# Patient Record
Sex: Female | Born: 1935 | Race: White | Hispanic: No
Health system: Midwestern US, Academic
[De-identification: ages and names within clinical notes are randomized; demographics above are authoritative.]

---

## 2017-03-31 ENCOUNTER — Encounter: Admit: 2017-03-31 | Discharge: 2017-03-31 | Payer: MEDICARE

## 2017-03-31 DIAGNOSIS — Z87891 Personal history of nicotine dependence: ICD-10-CM

## 2017-03-31 DIAGNOSIS — C4490 Unspecified malignant neoplasm of skin, unspecified: ICD-10-CM

## 2017-03-31 DIAGNOSIS — E7800 Pure hypercholesterolemia, unspecified: ICD-10-CM

## 2017-03-31 DIAGNOSIS — C50911 Malignant neoplasm of unspecified site of right female breast: ICD-10-CM

## 2017-03-31 DIAGNOSIS — I10 Essential (primary) hypertension: ICD-10-CM

## 2017-03-31 DIAGNOSIS — C50411 Malignant neoplasm of upper-outer quadrant of right female breast: ICD-10-CM

## 2017-03-31 DIAGNOSIS — Z170 Estrogen receptor positive status [ER+]: ICD-10-CM

## 2017-03-31 DIAGNOSIS — Z923 Personal history of irradiation: ICD-10-CM

## 2017-03-31 DIAGNOSIS — Z853 Personal history of malignant neoplasm of breast: ICD-10-CM

## 2017-03-31 DIAGNOSIS — N816 Rectocele: ICD-10-CM

## 2017-03-31 DIAGNOSIS — M549 Dorsalgia, unspecified: ICD-10-CM

## 2017-03-31 DIAGNOSIS — I499 Cardiac arrhythmia, unspecified: ICD-10-CM

## 2017-03-31 DIAGNOSIS — Z1231 Encounter for screening mammogram for malignant neoplasm of breast: Principal | ICD-10-CM

## 2017-03-31 NOTE — Progress Notes
Name: Stephanie Bentley          MRN: [id number]      DOB: 10-10-1932      AGE: 83 y.o.   DATE OF SERVICE: 03/31/2017    Subjective:             Reason for Visit: Ms. Stephanie Bentley returns to the clinic today for continued surveillance. She is 2.5 years s/p right lumpectomy/SLNB for a grade 2, hormone +, her2 negative, IDC. She denies any new findings on self-breast exam and she has no complaints today. She had screening mammograms prior to her visit today, and results are still pending.      Cancer Staging  Malignant neoplasm of right breast (HCC)  Staging form: Breast, AJCC 7th Edition  - Clinical: Stage IIA (T2, N0, cM0) - Signed by Dimas Alexandria, PA-C on 08/19/2014  - Pathologic: Stage IIA (T2, N0, cM0) - Signed by Jacqualin Combes, APRN-NP on 12/16/2014      History of Present Illness  DIAGNOSIS: 3.5cm Right, grade 2, estrogen +, IDC (ER100%, PR0%, HER2 0, Ki-67 68%) at 11:00, 0/4 lymph nodes, dx 07/2014, Stage IIA  PROCEDURE: Right breast sono-guided lumpectomy, right axillary sentinel lymph node biopsy (0/4), 08/29/14    HISTORY: Ms. Stephanie Bentley is a Caucasian female who presented to [facility] on 08/19/2014 at age 66 for evaluation of right breast cancer. [name] first felt a lump at the end of September 2015. She called her PCP and was sent for imaging. Bilateral diagnostic mammogram 07/10/14 [facility]) revealed a 3.5 cm bilobed mass in the right breast upper outer quadrant. Scattered calcifications were seen bilaterally. No other suspicious findings were seen. Right breast ultrasound 07/10/14 [facility]) revealed a 2.7 cm mass at 11:00, 3 cm FTN. Right breast sono-guided biopsy 07/25/14 [name]) revealed grade 2, estrogen positive, invasive ductal carcinoma. She underwent a right breast sono-guided lumpectomy, right axillary sentinel lymph node biopsy on 08/29/14. Pathology demonstrated a 3.5 cm ILC, margins negative, nuclear grade 2, histologic grade II, no LVI, 0/4 lymph nodes (ER100%, PR0%, HER2 0, Ki-67 68%). She completed Radiation Therapy with Dr. Georgia Dom in [place] at [place]. Thelma Barge in January, 2016. She started Arimidex per Dr. Biagio Borg in January, 2016.    OBGYN HISTORY: G5 P5, first live birth age 86, menarche age 76, menopause age 66, no hormone replacement therapy  PERTINENT PMH: Irregular heart beat controlled with medication  FAMILY HISTORY: No family history of breast, ovarian, prostate or pancreatic cancer  PHYSICAL EXAM on PRESENTATION: Left - No palpable breast masses. No skin, nipple, or areolar change. Right - 4 x 3.5 cm lump at 11:00, 5 cm FTN. No supraclavicular or axillary adenopathy.  MEDICAL ONCOLOGY: Dr. Biagio Borg Present Therapy: Arimidex  REFERRED BY: Self              Review of Systems  Constitutional: Negative for fever, chills, appetite change and fatigue.   HENT: Negative for hearing loss, congestion, rhinorrhea and tinnitus.    Eyes: Negative for pain, discharge and itching.   Respiratory: Negative for cough, chest tightness and shortness of breath.    Cardiovascular: Negative for chest pain and palpitations.   Gastrointestinal: Negative for abdominal distention, pain, nausea, vomiting, and diarrhea.   Genitourinary: Negative for frequency, vaginal bleeding, difficulty urinating and pelvic pain.   Musculoskeletal: Negative for myalgias, back pain, joint swelling and arthralgias.   Skin: Negative for rash.   Neurological: Negative for dizziness, weakness, light-headedness and headaches.   Hematological: Does not bruise/bleed easily.  Psychiatric/Behavioral: Negative for disturbed wake/sleep cycle. The patient is not nervous/anxious.    Allergies   Allergen Reactions   ??? Sulfa (Sulfonamide Antibiotics) NAUSEA ONLY     The following medical/surgical/family/social history and the list of medications are current, as of 03/31/2017    Past Medical History:   Diagnosis Date   ??? Back pain    ??? Cancer of skin    ??? Hypercholesterolemia 06/16/2016 ??? Hypertension 06/16/2016   ??? Invasive ductal carcinoma of right breast (HCC) 07/2014   ??? Irregular heart rate    ??? Rectocele 2014     Past Surgical History:   Procedure Laterality Date   ??? HEMORRHOIDECTOMY  1965   ??? LUMBAR LAMINECTOMY  2005    L4-5   ??? STIMULATOR IMPLANT  07/2012    BLADDER   ??? COLONOSCOPY  2014   ??? BREAST BIOPSY Right 07/24/14    NCBx   ??? BREAST LUMPECTOMY Right 08/29/14    with SLNB   ??? TONSIL AND ADENOIDECTOMY      as child     Family History   Problem Relation Age of Onset   ??? Diabetes Paternal Aunt    ??? Cancer Neg Hx      Social History     Social History   ??? Marital status: Widowed     Spouse name: N/A   ??? Number of children: N/A   ??? Years of education: N/A     Social History Main Topics   ??? Smoking status: Former Smoker     Packs/day: 1.00     Years: 40.00     Types: Cigarettes     Quit date: 09/17/1991   ??? Smokeless tobacco: Never Used   ??? Alcohol use 0.0 oz/week      Comment: social, less than weekly   ??? Drug use: No   ??? Sexual activity: Not on file     Other Topics Concern   ??? Not on file     Social History Narrative   ??? No narrative on file     Objective:         ??? acetaminophen (TYLENOL) 500 mg tablet Take 1,000 mg by mouth as Needed for Pain.   ??? amLODIPine (NORVASC) 5 mg tablet Take 1/2 tab by mouth twice a day   ??? anastrozole (ARIMIDEX) 1 mg tablet Take 1 tablet by mouth daily.   ??? CALCIUM PO Take  by mouth.   ??? cephalexin (KEFLEX) 500 mg capsule    ??? Cholecalciferol (Vitamin D3) 3,000 unit tab Take 1 Tab by mouth daily.   ??? fish oil /omega-3 fatty acids (SEA-OMEGA) 340/1000 mg capsule Take 1 Cap by mouth daily.   ??? glucosamine(+) 500 mg tab Take 1,500 mg by mouth daily.   ??? ibuprofen (MOTRIN) 200 mg tablet Take 400 mg by mouth as Needed for Pain.   ??? metoprolol (LOPRESSOR) 25 mg tablet Take 12.5 mg by mouth daily.   ??? NAPROXEN SODIUM (ALEVE PO) Take  by mouth as Needed.   ??? oxybutynin XL (DITROPAN XL) 10 mg tablet ??? potassium chloride SR (K-DUR) 10 mEq tablet Take 99 mEq by mouth daily. Take with a meal and a full glass of water.   ??? trimethoprim (TRIMPEX) 100 mg tablet Take 100 mg by mouth daily.   ??? vitamins, multiple tablet Take 1 Tab by mouth daily.     Vitals:    03/31/17 1018   BP: 123/63   Pulse: 69   Temp: 36.6 ???  C (97.9 ???F)   SpO2: 95%   Weight: 87.5 kg (192 lb 12.8 oz)   Height: 177.8 cm (70)     Body mass index is 27.66 kg/m???.               Pain Addressed:  N/A    Patient Evaluated for a Clinical Trial: No treatment clinical trial available for this patient.     [place] Cooperative Oncology Group performance status is 0, Fully active, able to carry on all pre-disease performance without restriction.Marland Kitchen     Physical Exam   Pulmonary/Chest:       Vitals reviewed.       Constitutional: No acute distress.  HEENT:  Head: Normocephalic and atraumatic.  Eyes: No discharge. No scleral icterus.  Pulmonary/Chest: No respiratory distress.   Lymphadenopathy: There is no axillary, infraclavicular, or supraclavicular adenopathy.  Neurological: Alert and oriented to person, place and time. No cranial nerve deficit.  Skin: Warm and dry. No rash noted. No erythema. No pallor.  Psychiatric: Normal mood and affect. Behavior is normal. Judgement and thought content normal.       Assessment and Plan:  1. 83 yo female 2.5 years s/p right lumpectomy/SLNB for a 3.5 cm, grade 2, estrogen +, IDC (ER100%, PR0%, HER2 0, Ki-67 68%), Stage IIA. No evidence of local or regional recurrence.  2. Completed right breast radiation therapy.  3. On Arimidex. Continue follow-up with Dr. Biagio Borg.  4. Screening mammograms today are pending; will call patient with results.  5. RTC in 6 months.    Carylon Perches, APRN    ???

## 2017-04-26 ENCOUNTER — Encounter: Admit: 2017-04-26 | Discharge: 2017-04-26 | Payer: MEDICARE

## 2017-04-26 NOTE — Telephone Encounter
Called and spoke with the patient to make her aware that her 07/05/2017 appointment would have to get rescheduled. Patient approved rescheduled to 07/19/2017 at 10:40am.

## 2017-07-19 ENCOUNTER — Encounter: Admit: 2017-07-19 | Discharge: 2017-07-19 | Payer: MEDICARE

## 2017-07-19 DIAGNOSIS — I10 Essential (primary) hypertension: ICD-10-CM

## 2017-07-19 DIAGNOSIS — Z170 Estrogen receptor positive status [ER+]: ICD-10-CM

## 2017-07-19 DIAGNOSIS — C50411 Malignant neoplasm of upper-outer quadrant of right female breast: Principal | ICD-10-CM

## 2017-07-19 DIAGNOSIS — C4490 Unspecified malignant neoplasm of skin, unspecified: ICD-10-CM

## 2017-07-19 DIAGNOSIS — I499 Cardiac arrhythmia, unspecified: ICD-10-CM

## 2017-07-19 DIAGNOSIS — M549 Dorsalgia, unspecified: ICD-10-CM

## 2017-07-19 DIAGNOSIS — C50911 Malignant neoplasm of unspecified site of right female breast: Principal | ICD-10-CM

## 2017-07-19 DIAGNOSIS — E7800 Pure hypercholesterolemia, unspecified: ICD-10-CM

## 2017-07-19 DIAGNOSIS — Z780 Asymptomatic menopausal state: ICD-10-CM

## 2017-07-19 DIAGNOSIS — Z79811 Long term (current) use of aromatase inhibitors: ICD-10-CM

## 2017-07-19 DIAGNOSIS — N816 Rectocele: ICD-10-CM

## 2017-07-19 NOTE — Progress Notes
Name: Stephanie Bentley         MRN: [id number]      DOB: 06/17/1932      AGE: 83 y.o.   DATE OF SERVICE: 07/19/2017      Subjective:             Reason for Visit:  Heme/Onc Care    Cancer Staging  Malignant neoplasm of right breast [facility])  Staging form: Breast, AJCC 7th Edition  - Clinical: Stage IIA (T2, N0, cM0) - Signed by Dimas Alexandria, PA-C on 08/19/2014  - Pathologic: Stage IIA (T2, N0, cM0) - Signed by Jacqualin Combes, APRN-NP on 12/16/2014      Oncology History    Stephanie Bentley is a very pleasant 79 y.o. postmenopausal female who presented in referral in referral from Dr. Arva Chafe for further management of right breast invasive ductal carcinoma, diagnosed 07/2014. [name] was in her usual state of good health when she appreciated a mass in her right breast in later September 2015. She then presented to her PCP for further evaluation. Bilateral diagnostic mammogram 07/10/14 [facility]) revealed a 3.5 cm bilobed mass in the right breast upper outer quadrant. Scattered calcifications were seen bilaterally. No other suspicious findings were seen. Right breast ultrasound 07/10/14 [facility]) revealed a 2.7 cm mass at 11:00, 3 cm FTN. Right breast sono-guided biopsy 07/25/14 [name]) revealed grade 2, estrogen positive, invasive ductal carcinoma. Mekah presented to the [facility] on 08/19/14. She underwent a right breast sono-guided lumpectomy, right axillary sentinel lymph node biopsy by Dr. Arva Chafe on 08/29/14. Pathology demonstrated a 3.5 cm ILC, margins negative, nuclear grade 2, histologic grade II, no LVI, 0/4 lymph nodes (ER100%, PR0%, HER2 0, Ki-67 68%).     Current Therapy: Arimidex, initiated 10/2014.    Ethie returns today, accompanied by her daughter, for routine 6 month follow for Arimidex and review recent imaging. She is feeling well. Continues to take Arimidex daily. Reports back pain. Denies arthralgias. Pt denies any current breast health issues, specifically denies any breast mass, pain, nipple discharge, skin changes, or rash. No new health conditions voiced.          Review of Systems   Constitutional: Negative for activity change, appetite change, chills, diaphoresis, fatigue, fever and unexpected weight change.   HENT: Positive for hearing loss. Negative for congestion, mouth sores, sore throat, tinnitus and trouble swallowing.    Eyes: Negative for redness (chronic, no concerns today) and visual disturbance.   Respiratory: Negative for cough, chest tightness, shortness of breath and wheezing.    Cardiovascular: Negative for chest pain, palpitations and leg swelling.   Gastrointestinal: Negative for abdominal distention, abdominal pain, blood in stool, constipation, diarrhea, nausea and vomiting.   Genitourinary: Positive for enuresis (chronic, no change), frequency and urgency (chronic). Negative for difficulty urinating, dysuria and hematuria.   Musculoskeletal: Positive for back pain (chronic, intermittent). Negative for arthralgias, gait problem, joint swelling, myalgias, neck pain and neck stiffness.   Skin: Negative for rash.   Neurological: Negative for dizziness, syncope, weakness, numbness and headaches.   Hematological: Negative for adenopathy. Bruises/bleeds easily.   Psychiatric/Behavioral: Negative for sleep disturbance. The patient is not nervous/anxious.      Past Medical History:   Diagnosis Date   ??? Back pain    ??? Cancer of skin    ??? Hypercholesterolemia 06/16/2016   ??? Hypertension 06/16/2016   ??? Invasive ductal carcinoma of right breast (HCC) 07/2014   ??? Irregular heart rate    ??? Rectocele  2014     Past Surgical History:   Procedure Laterality Date   ??? HEMORRHOIDECTOMY  1965   ??? LUMBAR LAMINECTOMY  2005    L4-5   ??? STIMULATOR IMPLANT  07/2012    BLADDER   ??? COLONOSCOPY  2014   ??? BREAST BIOPSY Right 07/24/14    NCBx   ??? BREAST LUMPECTOMY Right 08/29/14    with SLNB   ??? TONSIL AND ADENOIDECTOMY      as child     Family History   Problem Relation Age of Onset ??? Diabetes Paternal Aunt    ??? Cancer Neg Hx      Social History     Social History   ??? Marital status: Widowed     Spouse name: N/A   ??? Number of children: N/A   ??? Years of education: N/A     Social History Main Topics   ??? Smoking status: Former Smoker     Packs/day: 1.00     Years: 40.00     Types: Cigarettes     Quit date: 09/17/1991   ??? Smokeless tobacco: Never Used   ??? Alcohol use 0.0 oz/week      Comment: social, less than weekly   ??? Drug use: No   ??? Sexual activity: Not on file     Other Topics Concern   ??? Not on file     Social History Narrative   ??? No narrative on file           Objective:         ??? acetaminophen (TYLENOL) 500 mg tablet Take 1,000 mg by mouth as Needed for Pain.   ??? amLODIPine (NORVASC) 5 mg tablet Take 1/2 tab by mouth twice a day   ??? anastrozole (ARIMIDEX) 1 mg tablet Take 1 tablet by mouth daily.   ??? CALCIUM PO Take  by mouth.   ??? cephalexin (KEFLEX) 500 mg capsule    ??? Cholecalciferol (Vitamin D3) 3,000 unit tab Take 1 Tab by mouth daily.   ??? fish oil /omega-3 fatty acids (SEA-OMEGA) 340/1000 mg capsule Take 1 Cap by mouth daily.   ??? glucosamine(+) 500 mg tab Take 1,500 mg by mouth daily.   ??? ibuprofen (MOTRIN) 200 mg tablet Take 400 mg by mouth as Needed for Pain.   ??? metoprolol (LOPRESSOR) 25 mg tablet Take 12.5 mg by mouth daily.   ??? NAPROXEN SODIUM (ALEVE PO) Take  by mouth as Needed.   ??? oxybutynin XL (DITROPAN XL) 10 mg tablet    ??? potassium chloride SR (K-DUR) 10 mEq tablet Take 99 mEq by mouth daily. Take with a meal and a full glass of water.   ??? vitamins, multiple tablet Take 1 Tab by mouth daily.     Vitals:    07/19/17 1043   BP: 140/71   Pulse: 59   Resp: 18   Temp: 36.9 ???C (98.5 ???F)   TempSrc: Oral   SpO2: 95%   Weight: 85.9 kg (189 lb 6.4 oz)   Height: 177.8 cm (70)     Body mass index is 27.18 kg/m???.     Pain Score: Five (Back pain)         Pain Addressed:  Patient declines intervention and Patient to call office if pain not relieved or worsened Patient Evaluated for a Clinical Trial: No treatment clinical trial available for this patient.     [place] Cooperative Oncology Group performance status is 0, Fully active, able to carry  on all pre-disease performance without restriction.Marland Kitchen     Physical Exam   Constitutional: She is oriented to person, place, and time. She appears well-developed and well-nourished. No distress.   HENT:   Head: Normocephalic and atraumatic.   Mouth/Throat: Oropharynx is clear and moist. No oral lesions. No oropharyngeal exudate.   Eyes: Pupils are equal, round, and reactive to light. Conjunctivae and EOM are normal. No scleral icterus.   Neck: Normal range of motion. Neck supple. No tracheal deviation present.   Cardiovascular: Normal rate, regular rhythm and normal heart sounds.  Exam reveals no gallop and no friction rub.    No murmur heard.  Pulmonary/Chest: Effort normal and breath sounds normal. She has no wheezes. She has no rhonchi. She has no rales. Right breast exhibits no inverted nipple, no mass, no nipple discharge, no skin change and no tenderness. Left breast exhibits no inverted nipple, no mass, no nipple discharge, no skin change and no tenderness. Breasts are symmetrical. There is no breast swelling.       Abdominal: Soft. Normal appearance. She exhibits no distension and no mass. There is no tenderness. There is no guarding.   Genitourinary: No breast tenderness, discharge or bleeding.   Musculoskeletal: Normal range of motion. She exhibits no edema.   Lymphadenopathy:     She has no cervical adenopathy.     She has no axillary adenopathy.        Right: No supraclavicular adenopathy present.        Left: No supraclavicular adenopathy present.   Neurological: She is alert and oriented to person, place, and time. She has normal strength. Coordination normal.   Skin: Skin is warm and dry. No rash noted. She is not diaphoretic. No pallor.   Psychiatric: She has a normal mood and affect. Her behavior is normal. Judgment and thought content normal.   Vitals reviewed.       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 ???The   fracture risk is moderate.  Follow up DEXA scan in 1 year is suggested while the patient is on   therapy.   -T-score of -1.4 .???    DIGITAL MAMMO SCREEN BILAT/TOMO/CAD: March 31, 2017  -There are scattered areas of fibroglandular density.  BIRAD 2-Benign.     Assessment and Plan:     The patient is a 83 y.o. postmenopausal female with the following:    1.  Right 3.5 cm ILC, grade II, no LVI, 0/4 lymph nodes.  Stage IIA; pT2 N0 M0.  ER100%, PR0%, HER2 0, Ki-67 68%. S/p lumpectomy with SLNB on 08/29/14.  Pathologic Staging: IIA pT2 N0 cM0. No clinical evidence of disease. S/p right breast radiation 09/25/14-10/20/14.   2.  Mild osteopenia, T-score -1.4 per DEXA 12/28/16, next DEXA due 12/2017.     Theda is without evidence of disease recurrence at this time based on physical exam and recent imaging. We will continue Arimidex at this time.    Reviewed imaging with patient.    Dexa to evaluate bone health.  Continue calcium and vitamin D supplements as well as weight bearing exercise (such as walking and light strength training) for bone health. Recommended 30 minutes of exercise most days of the week.    Nohelani was encouraged to call with any persistent symptoms, questions, or concerns.    We will see Lyndsy in 6 months for routine follow up.  In the presence of Dellia Cloud, MD,  I have taken down these notes, Clarene Duke, LPN, Scribe. 07/19/2017 11:32 AM.

## 2017-10-03 ENCOUNTER — Encounter: Admit: 2017-10-03 | Discharge: 2017-10-03 | Payer: MEDICARE

## 2017-10-03 DIAGNOSIS — M549 Dorsalgia, unspecified: ICD-10-CM

## 2017-10-03 DIAGNOSIS — Z87891 Personal history of nicotine dependence: ICD-10-CM

## 2017-10-03 DIAGNOSIS — C4490 Unspecified malignant neoplasm of skin, unspecified: ICD-10-CM

## 2017-10-03 DIAGNOSIS — E7800 Pure hypercholesterolemia, unspecified: ICD-10-CM

## 2017-10-03 DIAGNOSIS — C50911 Malignant neoplasm of unspecified site of right female breast: Principal | ICD-10-CM

## 2017-10-03 DIAGNOSIS — Z1231 Encounter for screening mammogram for malignant neoplasm of breast: ICD-10-CM

## 2017-10-03 DIAGNOSIS — N816 Rectocele: ICD-10-CM

## 2017-10-03 DIAGNOSIS — I499 Cardiac arrhythmia, unspecified: ICD-10-CM

## 2017-10-03 DIAGNOSIS — Z170 Estrogen receptor positive status [ER+]: ICD-10-CM

## 2017-10-03 DIAGNOSIS — I10 Essential (primary) hypertension: ICD-10-CM

## 2017-10-03 NOTE — Progress Notes
Name: Stephanie Bentley          MRN: [id number]      DOB: 05-08-1932      AGE: 83 y.o.   DATE OF SERVICE: 10/03/2017    Subjective:             Reason for Visit: Stephanie Bentley returns to the clinica today for continue surveillance. She is 3 years s/p right lumpectomy/SLNB for a grade 2, hormone +, Her2 negative, IDC. She denies any new findings on self-breast exam and she has no complaints today. She states that she recently had two skin cancers removed from the back of her neck.        Cancer Staging  Malignant neoplasm of right breast (HCC)  Staging form: Breast, AJCC 7th Edition  - Clinical: Stage IIA (T2, N0, cM0) - Signed by Dimas Alexandria, PA-C on 08/19/2014  - Pathologic: Stage IIA (T2, N0, cM0) - Signed by Jacqualin Combes, APRN-NP on 12/16/2014      History of Present Illness  DIAGNOSIS: 3.5cm Right, grade 2, estrogen +, IDC (ER100%, PR0%, HER2 0, Ki-67 68%) at 11:00, 0/4 lymph nodes, dx 07/2014, Stage IIA  PROCEDURE: Right breast sono-guided lumpectomy, right axillary sentinel lymph node biopsy (0/4), 08/29/14    HISTORY: Stephanie Bentley is a Caucasian female who presented to [facility] on 08/19/2014 at age 28 for evaluation of right breast cancer. Stephanie Bentley first felt a lump at the end of September 2015. She called her PCP and was sent for imaging. Bilateral diagnostic mammogram 07/10/14 [facility]) revealed a 3.5 cm bilobed mass in the right breast upper outer quadrant. Scattered calcifications were seen bilaterally. No other suspicious findings were seen. Right breast ultrasound 07/10/14 [facility]) revealed a 2.7 cm mass at 11:00, 3 cm FTN. Right breast sono-guided biopsy 07/25/14 [name]) revealed grade 2, estrogen positive, invasive ductal carcinoma. She underwent a right breast sono-guided lumpectomy, right axillary sentinel lymph node biopsy on 08/29/14. Pathology demonstrated a 3.5 cm ILC, margins negative, nuclear grade 2, histologic grade II, no LVI, 0/4 lymph nodes (ER100%, PR0%, HER2 0, Ki-67 68%). She completed Radiation Therapy with Dr. Georgia Dom in [place] at [place]. Thelma Barge in January, 2016. She started Arimidex per Dr. Biagio Borg in January, 2016.    OBGYN HISTORY: G5 P5, first live birth age 26, menarche age 81, menopause age 71, no hormone replacement therapy  PERTINENT PMH: Irregular heart beat controlled with medication  FAMILY HISTORY: No family history of breast, ovarian, prostate or pancreatic cancer  PHYSICAL EXAM on PRESENTATION: Left - No palpable breast masses. No skin, nipple, or areolar change. Right - 4 x 3.5 cm lump at 11:00, 5 cm FTN. No supraclavicular or axillary adenopathy.  MEDICAL ONCOLOGY: Dr. Biagio Borg Present Therapy: Arimidex  REFERRED BY: Self              Review of Systems  Constitutional: Negative for fever, chills, appetite change and fatigue.   HENT: Negative for hearing loss, congestion, rhinorrhea and tinnitus.    Eyes: Negative for pain, discharge and itching.   Respiratory: Negative for cough, chest tightness and shortness of breath.    Cardiovascular: Negative for chest pain and palpitations.   Gastrointestinal: Negative for abdominal distention, pain, nausea, vomiting, and diarrhea.   Genitourinary: Negative for frequency, vaginal bleeding, difficulty urinating and pelvic pain.   Musculoskeletal: Negative for myalgias, back pain, joint swelling and arthralgias.   Skin: Negative for rash.   Neurological: Negative for dizziness, weakness, light-headedness and headaches.   Hematological: Does  not bruise/bleed easily.   Psychiatric/Behavioral: Negative for disturbed wake/sleep cycle. The patient is not nervous/anxious.    Allergies   Allergen Reactions   ??? Amoxicillin HIVES   ??? Sulfa (Sulfonamide Antibiotics) NAUSEA ONLY     Past Medical History:   Diagnosis Date   ??? Back pain    ??? Cancer of skin    ??? Hypercholesterolemia 06/16/2016   ??? Hypertension 06/16/2016   ??? Invasive ductal carcinoma of right breast (HCC) 07/2014 ??? Irregular heart rate    ??? Rectocele 2014     Past Surgical History:   Procedure Laterality Date   ??? HEMORRHOIDECTOMY  1965   ??? LUMBAR LAMINECTOMY  2005    L4-5   ??? STIMULATOR IMPLANT  07/2012    BLADDER   ??? COLONOSCOPY  2014   ??? BREAST BIOPSY Right 07/24/14    NCBx   ??? BREAST LUMPECTOMY Right 08/29/14    with SLNB   ??? TONSIL AND ADENOIDECTOMY      as child     Family History   Problem Relation Age of Onset   ??? Diabetes Paternal Aunt    ??? Cancer Neg Hx      Social History     Socioeconomic History   ??? Marital status: Widowed     Spouse name: Not on file   ??? Number of children: Not on file   ??? Years of education: Not on file   ??? Highest education level: Not on file   Social Needs   ??? Financial resource strain: Not on file   ??? Food insecurity - worry: Not on file   ??? Food insecurity - inability: Not on file   ??? Transportation needs - medical: Not on file   ??? Transportation needs - non-medical: Not on file   Occupational History   ??? Not on file   Tobacco Use   ??? Smoking status: Former Smoker     Packs/day: 1.00     Years: 40.00     Pack years: 40.00     Types: Cigarettes     Last attempt to quit: 09/17/1991     Years since quitting: 26.0   ??? Smokeless tobacco: Never Used   Substance and Sexual Activity   ??? Alcohol use: Yes     Alcohol/week: 0.0 oz     Comment: social, less than weekly   ??? Drug use: No   ??? Sexual activity: Not on file   Other Topics Concern   ??? Not on file   Social History Narrative   ??? Not on file       Objective:         ??? acetaminophen (TYLENOL) 500 mg tablet Take 1,000 mg by mouth as Needed for Pain.   ??? amLODIPine (NORVASC) 5 mg tablet Take 1/2 tab by mouth twice a day   ??? anastrozole (ARIMIDEX) 1 mg tablet Take 1 tablet by mouth daily.   ??? CALCIUM PO Take  by mouth.   ??? cephalexin (KEFLEX) 500 mg capsule    ??? Cholecalciferol (Vitamin D3) 3,000 unit tab Take 1 Tab by mouth daily.   ??? fish oil /omega-3 fatty acids (SEA-OMEGA) 340/1000 mg capsule Take 1 Cap by mouth daily. ??? glucosamine(+) 500 mg tab Take 1,500 mg by mouth daily.   ??? ibuprofen (MOTRIN) 200 mg tablet Take 400 mg by mouth as Needed for Pain.   ??? metoprolol (LOPRESSOR) 25 mg tablet Take 12.5 mg by mouth daily.   ??? NAPROXEN SODIUM (  ALEVE PO) Take  by mouth as Needed.   ??? oxybutynin XL (DITROPAN XL) 10 mg tablet    ??? potassium chloride SR (K-DUR) 10 mEq tablet Take 99 mEq by mouth daily. Take with a meal and a full glass of water.   ??? vitamins, multiple tablet Take 1 Tab by mouth daily.     Vitals:    10/03/17 1547 10/03/17 1549   BP: 142/67    Pulse: 72    Resp: 18 18   Temp: 36.5 ???C (97.7 ???F)    TempSrc: Oral Oral   SpO2: 98%    Weight: 85.8 kg (189 lb 3.2 oz)    Height: 177.8 cm (70)      Body mass index is 27.15 kg/m???.     Pain Score: Zero         Pain Addressed:  N/A    Patient Evaluated for a Clinical Trial: No treatment clinical trial available for this patient.     [place] Cooperative Oncology Group performance status is 0, Fully active, able to carry on all pre-disease performance without restriction.Marland Kitchen     Physical Exam   Pulmonary/Chest:       Vitals reviewed.       Constitutional: No acute distress.  HEENT:  Head: Normocephalic and atraumatic.  Eyes: No discharge. No scleral icterus.  Pulmonary/Chest: No respiratory distress.   Lymphadenopathy: There is no axillary, infraclavicular, or supraclavicular adenopathy.  Neurological: Alert and oriented to person, place and time. No cranial nerve deficit.  Skin: Warm and dry. No rash noted. No erythema. No pallor.  Psychiatric: Normal mood and affect. Behavior is normal. Judgement and thought content normal.       Assessment and Plan:  1. 83 yo female 3???years s/p right lumpectomy/SLNB for a 3.5 cm, grade 2, ER+, IDC (ER100%, PR0%, HER2 0, Ki-67 68%), Stage IIA. No evidence of local or regional recurrence.  2. Completed???right breast radiation therapy.  3. On Arimidex. Continue follow-up with Dr. Biagio Borg.  4. Screening mammograms in June 2018 were BIRADS 2. 5. Due to the number of health care visits the patient now has, it was discussed that she may continue follow-up with med/onc only and return to the surgery clinic prn since she is 3 years out from surgery. She was agreeable.  6. Will schedule screening mammograms for June 2019. RTC prn    Carylon Perches, APRN       ???

## 2017-10-12 ENCOUNTER — Encounter: Admit: 2017-10-12 | Discharge: 2017-10-12 | Payer: MEDICARE

## 2017-10-12 MED ORDER — ANASTROZOLE 1 MG PO TAB
1 mg | ORAL_TABLET | Freq: Every day | ORAL | 3 refills | 33.00000 days | Status: AC
Start: 2017-10-12 — End: 2018-10-02

## 2017-12-27 ENCOUNTER — Encounter: Admit: 2017-12-27 | Discharge: 2017-12-27 | Payer: MEDICARE

## 2018-01-19 ENCOUNTER — Encounter: Admit: 2018-01-19 | Discharge: 2018-01-19 | Payer: MEDICARE

## 2018-01-19 DIAGNOSIS — Z170 Estrogen receptor positive status [ER+]: ICD-10-CM

## 2018-01-19 DIAGNOSIS — C50411 Malignant neoplasm of upper-outer quadrant of right female breast: ICD-10-CM

## 2018-01-19 DIAGNOSIS — Z780 Asymptomatic menopausal state: ICD-10-CM

## 2018-01-19 DIAGNOSIS — I10 Essential (primary) hypertension: ICD-10-CM

## 2018-01-19 DIAGNOSIS — Z79811 Long term (current) use of aromatase inhibitors: ICD-10-CM

## 2018-01-19 DIAGNOSIS — C50911 Malignant neoplasm of unspecified site of right female breast: Principal | ICD-10-CM

## 2018-01-19 DIAGNOSIS — N816 Rectocele: ICD-10-CM

## 2018-01-19 DIAGNOSIS — M549 Dorsalgia, unspecified: ICD-10-CM

## 2018-01-19 DIAGNOSIS — I499 Cardiac arrhythmia, unspecified: ICD-10-CM

## 2018-01-19 DIAGNOSIS — M8580 Other specified disorders of bone density and structure, unspecified site: Principal | ICD-10-CM

## 2018-01-19 DIAGNOSIS — E7800 Pure hypercholesterolemia, unspecified: ICD-10-CM

## 2018-01-19 DIAGNOSIS — C4490 Unspecified malignant neoplasm of skin, unspecified: ICD-10-CM

## 2018-03-09 ENCOUNTER — Encounter: Admit: 2018-03-09 | Discharge: 2018-03-09 | Payer: MEDICARE

## 2018-03-29 ENCOUNTER — Ambulatory Visit: Admit: 2018-03-29 | Discharge: 2018-03-30 | Payer: MEDICARE

## 2018-03-29 ENCOUNTER — Encounter: Admit: 2018-03-29 | Discharge: 2018-03-29 | Payer: MEDICARE

## 2018-03-29 DIAGNOSIS — R609 Edema, unspecified: Principal | ICD-10-CM

## 2018-04-13 ENCOUNTER — Encounter: Admit: 2018-04-13 | Discharge: 2018-04-13 | Payer: MEDICARE

## 2018-04-13 DIAGNOSIS — Z1231 Encounter for screening mammogram for malignant neoplasm of breast: Principal | ICD-10-CM

## 2018-08-01 ENCOUNTER — Encounter: Admit: 2018-08-01 | Discharge: 2018-08-01 | Payer: MEDICARE

## 2018-08-01 DIAGNOSIS — I499 Cardiac arrhythmia, unspecified: ICD-10-CM

## 2018-08-01 DIAGNOSIS — C50911 Malignant neoplasm of unspecified site of right female breast: Principal | ICD-10-CM

## 2018-08-01 DIAGNOSIS — M8580 Other specified disorders of bone density and structure, unspecified site: ICD-10-CM

## 2018-08-01 DIAGNOSIS — I10 Essential (primary) hypertension: ICD-10-CM

## 2018-08-01 DIAGNOSIS — C4490 Unspecified malignant neoplasm of skin, unspecified: ICD-10-CM

## 2018-08-01 DIAGNOSIS — Z87891 Personal history of nicotine dependence: ICD-10-CM

## 2018-08-01 DIAGNOSIS — Z170 Estrogen receptor positive status [ER+]: ICD-10-CM

## 2018-08-01 DIAGNOSIS — N816 Rectocele: ICD-10-CM

## 2018-08-01 DIAGNOSIS — E7800 Pure hypercholesterolemia, unspecified: ICD-10-CM

## 2018-08-01 DIAGNOSIS — M549 Dorsalgia, unspecified: ICD-10-CM

## 2018-08-01 DIAGNOSIS — C50411 Malignant neoplasm of upper-outer quadrant of right female breast: Principal | ICD-10-CM

## 2018-08-01 DIAGNOSIS — Z79811 Long term (current) use of aromatase inhibitors: ICD-10-CM

## 2018-10-02 ENCOUNTER — Encounter: Admit: 2018-10-02 | Discharge: 2018-10-02 | Payer: MEDICARE

## 2018-10-02 MED ORDER — ANASTROZOLE 1 MG PO TAB
1 mg | ORAL_TABLET | Freq: Every day | ORAL | 3 refills | 33.00000 days | Status: AC
Start: 2018-10-02 — End: 2019-09-09

## 2019-01-08 ENCOUNTER — Encounter: Admit: 2019-01-08 | Discharge: 2019-01-08 | Payer: MEDICARE

## 2019-04-04 ENCOUNTER — Encounter: Admit: 2019-04-04 | Discharge: 2019-04-04

## 2019-04-04 DIAGNOSIS — Z1231 Encounter for screening mammogram for malignant neoplasm of breast: Secondary | ICD-10-CM

## 2019-04-11 NOTE — Progress Notes
Name: Stephanie Bentley         MRN: [id number]      DOB: Sep 15, 1932      AGE: 85 y.o.   DATE OF SERVICE: 04/12/2019      Subjective:             Reason for Visit:  Heme/Onc Care    Cancer Staging  Malignant neoplasm of right breast [facility])  Staging form: Breast, AJCC 7th Edition  - Clinical: Stage IIA (T2, N0, cM0) - Signed by Dimas Alexandria, PA-C on 08/19/2014  - Pathologic: Stage IIA (T2, N0, cM0) - Signed by Jacqualin Combes, APRN-NP on 12/16/2014      Oncology History    Stephanie Bentley is a very pleasant 87 y.o. postmenopausal female who presented in referral in referral from Dr. Arva Chafe for further management of right breast invasive ductal carcinoma, diagnosed 07/2014. Stephanie Bentley was in her usual state of good health when she appreciated a mass in her right breast in later September 2015. She then presented to her PCP for further evaluation. Bilateral diagnostic mammogram 07/10/14 [facility]) revealed a 3.5 cm bilobed mass in the right breast upper outer quadrant. Scattered calcifications were seen bilaterally. No other suspicious findings were seen. Right breast ultrasound 07/10/14 [facility]) revealed a 2.7 cm mass at 11:00, 3 cm FTN. Right breast sono-guided biopsy 07/25/14 [name]) revealed grade 2, estrogen positive, invasive ductal carcinoma. [name] presented to the [facility] on 08/19[facility]14. She underwent a right breast sono-guided lumpectomy, right axillary sentinel lymph node biopsy by Dr. Arva Chafe on 08/29/14. Pathology demonstrated a 3.5 cm ILC, margins negative, nuclear grade 2, histologic grade II, no LVI, 0/4 lymph nodes (ER100%, PR0%, HER2 0, Ki-67 68%).     Current Therapy: Arimidex, initiated 10/2014.    History of Present Illness   Stephanie Bentley returns today, accompanied by her daughter, for routine 6 month follow up for Arimidex. She is feeling well. Continues to take Arimidex daily. Reports no change in chronic back pain.  Pt denies any current breast health issues, specifically denies any breast mass, pain, nipple discharge, skin changes, or rash. No new health conditions voiced.  No new health conditions voiced.          Review of Systems   Constitutional: Negative for activity change, appetite change, chills, diaphoresis, fatigue, fever and unexpected weight change.   HENT: Positive for hearing loss. Negative for congestion, mouth sores, sore throat, tinnitus and trouble swallowing.    Eyes: Negative for redness and visual disturbance.   Respiratory: Negative for cough, chest tightness, shortness of breath and wheezing.    Cardiovascular: Negative for chest pain, palpitations and leg swelling.   Gastrointestinal: Negative for abdominal distention, abdominal pain, blood in stool, constipation, diarrhea, nausea and vomiting.   Genitourinary: Positive for enuresis (chronic, no change) and urgency (chronic). Negative for difficulty urinating, dysuria, frequency and hematuria.   Musculoskeletal: Positive for back pain (chronic, intermittent). Negative for arthralgias, gait problem, joint swelling, myalgias, neck pain and neck stiffness.   Skin: Negative for rash.   Neurological: Negative for dizziness, syncope, weakness, numbness and headaches.   Hematological: Negative for adenopathy. Bruises/bleeds easily.   Psychiatric/Behavioral: Negative for sleep disturbance. The patient is not nervous/anxious.      Medical History:   Diagnosis Date   ??? Back pain    ??? Cancer of skin    ??? Hypercholesterolemia 06/16/2016   ??? Hypertension 06/16/2016   ??? Invasive ductal carcinoma of right breast (HCC) 07/2014   ??? Irregular  heart rate    ??? Rectocele 2014     Surgical History:   Procedure Laterality Date   ??? HEMORRHOIDECTOMY  1965   ??? LUMBAR LAMINECTOMY  2005    L4-5   ??? STIMULATOR IMPLANT  07/2012    BLADDER   ??? COLONOSCOPY  2014   ??? BREAST BIOPSY Right 07/24/14    NCBx   ??? BREAST LUMPECTOMY Right 08/29/14    with SLNB   ??? TONSIL AND ADENOIDECTOMY      as child Family History   Problem Relation Age of Onset   ??? Diabetes Paternal Aunt    ??? Cancer Neg Hx      Social History     Socioeconomic History   ??? Marital status: Widowed     Spouse name: Not on file   ??? Number of children: Not on file   ??? Years of education: Not on file   ??? Highest education level: Not on file   Occupational History   ??? Not on file   Tobacco Use   ??? Smoking status: Former Smoker     Packs/day: 1.00     Years: 40.00     Pack years: 40.00     Types: Cigarettes     Last attempt to quit: 09/17/1991     Years since quitting: 27.5   ??? Smokeless tobacco: Never Used   Substance and Sexual Activity   ??? Alcohol use: Yes     Alcohol/week: 0.0 standard drinks     Comment: social, less than weekly   ??? Drug use: No   ??? Sexual activity: Not on file   Other Topics Concern   ??? Not on file   Social History Narrative   ??? Not on file       Objective:         ??? acetaminophen (TYLENOL) 500 mg tablet Take 1,000 mg by mouth as Needed for Pain.   ??? amLODIPine (NORVASC) 5 mg tablet Take 1/2 tab by mouth twice a day   ??? anastrozole (ARIMIDEX) 1 mg tablet Take one tablet by mouth daily.   ??? CALCIUM PO Take  by mouth.   ??? cephalexin (KEFLEX) 500 mg capsule    ??? Cholecalciferol (Vitamin D3) 3,000 unit tab Take 1 Tab by mouth daily.   ??? fish oil /omega-3 fatty acids (SEA-OMEGA) 340/1000 mg capsule Take 1 Cap by mouth daily.   ??? glucosamine(+) 500 mg tab Take 1,500 mg by mouth daily.   ??? ibuprofen (MOTRIN) 200 mg tablet Take 400 mg by mouth as Needed for Pain.   ??? lactobacillus combination no.4 (SENIOR PROBIOTIC PO) Take  by mouth.   ??? metoprolol (LOPRESSOR) 25 mg tablet Take 12.5 mg by mouth daily.   ??? NAPROXEN SODIUM (ALEVE PO) Take  by mouth as Needed.   ??? oxybutynin XL (DITROPAN XL) 10 mg tablet    ??? polyethylene glycol 3350 (MIRALAX) 17 g packet Take 17 g by mouth daily.   ??? potassium chloride SR (K-DUR) 10 mEq tablet Take 99 mEq by mouth daily. Take with a meal and a full glass of water. ??? psyllium (METAMUCIL) 3.4 gram packet Take  by mouth twice daily.   ??? trospium(+) (SANCTURA) 20 mg tablet Take 20 mg by mouth three times daily before meals.   ??? vitamins, multiple tablet Take 1 Tab by mouth daily.     Vitals:    04/12/19 1147 04/12/19 1154   BP: 136/75    BP Source: Arm, Left  Upper    Patient Position: Sitting    Pulse: 75    Resp: 16    Temp: 36.4 ???C (97.6 ???F)    TempSrc: Oral    SpO2: 96%    Weight: 87.2 kg (192 lb 3.2 oz)    Height: 175.3 cm (69.02)    PainSc:  Zero     Body mass index is 28.37 kg/m???.     Pain Score: Zero         Pain Addressed:  Patient declines intervention and Patient to call office if pain not relieved or worsened  Patient Evaluated for a Clinical Trial: No treatment clinical trial available for this patient.     [place] Cooperative Oncology Group performance status is 0, Fully active, able to carry on all pre-disease performance without restriction.Marland Kitchen     Physical Exam  Vitals signs reviewed.   Constitutional:       General: She is not in acute distress.     Appearance: Normal appearance. She is well-developed. She is not diaphoretic.   HENT:      Head: Normocephalic and atraumatic.      Mouth/Throat:      Mouth: No oral lesions.      Pharynx: No oropharyngeal exudate.   Eyes:      General: No scleral icterus.     Conjunctiva/sclera: Conjunctivae normal.      Pupils: Pupils are equal, round, and reactive to light.   Neck:      Musculoskeletal: Normal range of motion and neck supple.      Trachea: No tracheal deviation.   Cardiovascular:      Rate and Rhythm: Normal rate and regular rhythm.      Heart sounds: Normal heart sounds. No murmur. No friction rub. No gallop.    Pulmonary:      Effort: Pulmonary effort is normal.      Breath sounds: Normal breath sounds. No wheezing, rhonchi or rales.   Chest:      Breasts: Breasts are symmetrical.         Right: No inverted nipple, mass, nipple discharge, skin change or tenderness. Left: No inverted nipple, mass, nipple discharge, skin change or tenderness.       Abdominal:      General: There is no distension.      Palpations: Abdomen is soft. There is no mass.      Tenderness: There is no abdominal tenderness. There is no guarding.   Musculoskeletal: Normal range of motion.   Lymphadenopathy:      Cervical: No cervical adenopathy.      Upper Body:      Right upper body: No supraclavicular adenopathy.      Left upper body: No supraclavicular adenopathy.   Skin:     General: Skin is warm and dry.      Coloration: Skin is not pale.      Findings: No rash.   Neurological:      Mental Status: She is alert and oriented to person, place, and time.      Coordination: Coordination normal.   Psychiatric:         Behavior: Behavior normal.         Thought Content: Thought content normal.         Judgment: Judgment normal.          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 ???The   fracture risk is moderate.  Follow up DEXA scan in  1 year is suggested while the patient is on   therapy.   -T-score of -1.4 .???    DIGITAL MAMMO SCREEN BILAT/TOMO/CAD: March 31, 2017  -There are scattered areas of fibroglandular density.  BIRAD 2-Benign.    Mammogram (04/13/2018):  There are scattered areas of fibroglandular density.   BIRAD-2 benign    DEXA (01/19/2018):    LEFT FOREARM (radius 33%)  Current: 0.689 g/cm2, T-score of -2.2, Previous: 0.789 g/cm2, T-score of -1.1  LEFT FEMORAL NECK ???  Current: 0.887 g/cm2, T-score of -1.1, Previous: 0.950 g/cm2, T-score of -0.6  LEFT TOTAL HIP ???  Current: 0.802 g/cm2, T-score of -1.6, Previous: 0.883 g/cm2, T-score of -1.0    DEXA 04/12/2019: pending       Assessment and Plan:     The patient is a 85 y.o. postmenopausal female with the following:    1.  Right 3.5 cm ILC, grade II, no LVI, 0/4 lymph nodes.  Stage IIA; pT2 N0 M0.  ER100%, PR0%, HER2 0, Ki-67 68%. S/p lumpectomy with SLNB on 08/29/14.  Pathologic Staging: IIA pT2 N0 cM0. No clinical evidence of disease. S/p right breast radiation 09/25/14-10/20/14.   2.  Osteopenia: T -2.2 in left forearm, -1.1 in left femoral neck, -1.6 in left total hip, on calcium supplement and vitamin D.    Plan:  -Tonasia is without evidence of disease at this time.   - Continue with armidex 1 mg po daily (started in 10/2014)  - Repeat DEXA scan in 6 months, today results pedning.   - Repeat mammogram in 03/2019, delayed due to COVID, scheduled for 05/18/19.  - RTC in 6 months    Dr. Biagio Borg is the collaborating physician.     Jacqualin Combes, APRN-NP

## 2019-04-11 NOTE — Patient Instructions
Thank you for coming to see us today.   Please call our office or send a message through MyChart if you have any questions or concerns.          Dr. Anne O'Dea and Michelle Prager, APRN  [phone] (Nurse Tara Gardner/Udell Mazzocco)  [phone] (fax)    [facility] Location  [address]  [place], [place] [address]    [facility] Location  [address]  [place], [place] [address]

## 2019-04-12 ENCOUNTER — Encounter: Admit: 2019-04-12 | Discharge: 2019-04-12

## 2019-04-12 DIAGNOSIS — C50411 Malignant neoplasm of upper-outer quadrant of right female breast: Secondary | ICD-10-CM

## 2019-04-12 DIAGNOSIS — I10 Essential (primary) hypertension: Secondary | ICD-10-CM

## 2019-04-12 DIAGNOSIS — I499 Cardiac arrhythmia, unspecified: Secondary | ICD-10-CM

## 2019-04-12 DIAGNOSIS — N816 Rectocele: Secondary | ICD-10-CM

## 2019-04-12 DIAGNOSIS — E7800 Pure hypercholesterolemia, unspecified: Secondary | ICD-10-CM

## 2019-04-12 DIAGNOSIS — C4490 Unspecified malignant neoplasm of skin, unspecified: Secondary | ICD-10-CM

## 2019-04-12 DIAGNOSIS — M8580 Other specified disorders of bone density and structure, unspecified site: Secondary | ICD-10-CM

## 2019-04-12 DIAGNOSIS — C50911 Malignant neoplasm of unspecified site of right female breast: Secondary | ICD-10-CM

## 2019-04-12 DIAGNOSIS — Z170 Estrogen receptor positive status [ER+]: Secondary | ICD-10-CM

## 2019-04-12 DIAGNOSIS — M549 Dorsalgia, unspecified: Secondary | ICD-10-CM

## 2019-04-12 DIAGNOSIS — Z79811 Long term (current) use of aromatase inhibitors: Secondary | ICD-10-CM

## 2019-05-18 ENCOUNTER — Encounter: Admit: 2019-05-18 | Discharge: 2019-05-18

## 2019-05-18 DIAGNOSIS — Z1231 Encounter for screening mammogram for malignant neoplasm of breast: Principal | ICD-10-CM

## 2019-05-20 ENCOUNTER — Encounter: Admit: 2019-05-20 | Discharge: 2019-05-20

## 2019-05-20 NOTE — Telephone Encounter
-----   Message from Delford Field, APRN-NP sent at 05/20/2019  8:04 AM CDT -----  Please let pt know result negative.

## 2019-09-09 ENCOUNTER — Encounter: Admit: 2019-09-09 | Discharge: 2019-09-09 | Payer: MEDICARE

## 2019-09-09 MED ORDER — ANASTROZOLE 1 MG PO TAB
ORAL_TABLET | Freq: Every day | ORAL | 3 refills | 33.00000 days | Status: AC
Start: 2019-09-09 — End: ?

## 2019-10-24 NOTE — Patient Instructions
Thank you for coming to see us today.   Please call our office or send a message through MyChart if you have any questions or concerns.          Dr. Anne O'Dea and Michelle Prager, APRN  [phone] (Nurse Tara Gardner/Michele Kuester)  [phone] (fax)    [facility] [facility]  [address]  [place], [place] [address]    [facility] [facility]  [address]  [place], [place] [address]

## 2019-10-25 ENCOUNTER — Encounter: Admit: 2019-10-25 | Discharge: 2019-10-25 | Payer: MEDICARE

## 2019-10-25 DIAGNOSIS — I10 Essential (primary) hypertension: Secondary | ICD-10-CM

## 2019-10-25 DIAGNOSIS — E7800 Pure hypercholesterolemia, unspecified: Secondary | ICD-10-CM

## 2019-10-25 DIAGNOSIS — Z1231 Encounter for screening mammogram for malignant neoplasm of breast: Secondary | ICD-10-CM

## 2019-10-25 DIAGNOSIS — N816 Rectocele: Secondary | ICD-10-CM

## 2019-10-25 DIAGNOSIS — I499 Cardiac arrhythmia, unspecified: Secondary | ICD-10-CM

## 2019-10-25 DIAGNOSIS — C50911 Malignant neoplasm of unspecified site of right female breast: Secondary | ICD-10-CM

## 2019-10-25 DIAGNOSIS — M549 Dorsalgia, unspecified: Secondary | ICD-10-CM

## 2019-10-25 DIAGNOSIS — C4490 Unspecified malignant neoplasm of skin, unspecified: Secondary | ICD-10-CM

## 2020-06-29 IMAGING — CR UP_EXM
2 series · 2 of 2 positions shown · non-contrast
Comparison: none

[wrist pa]
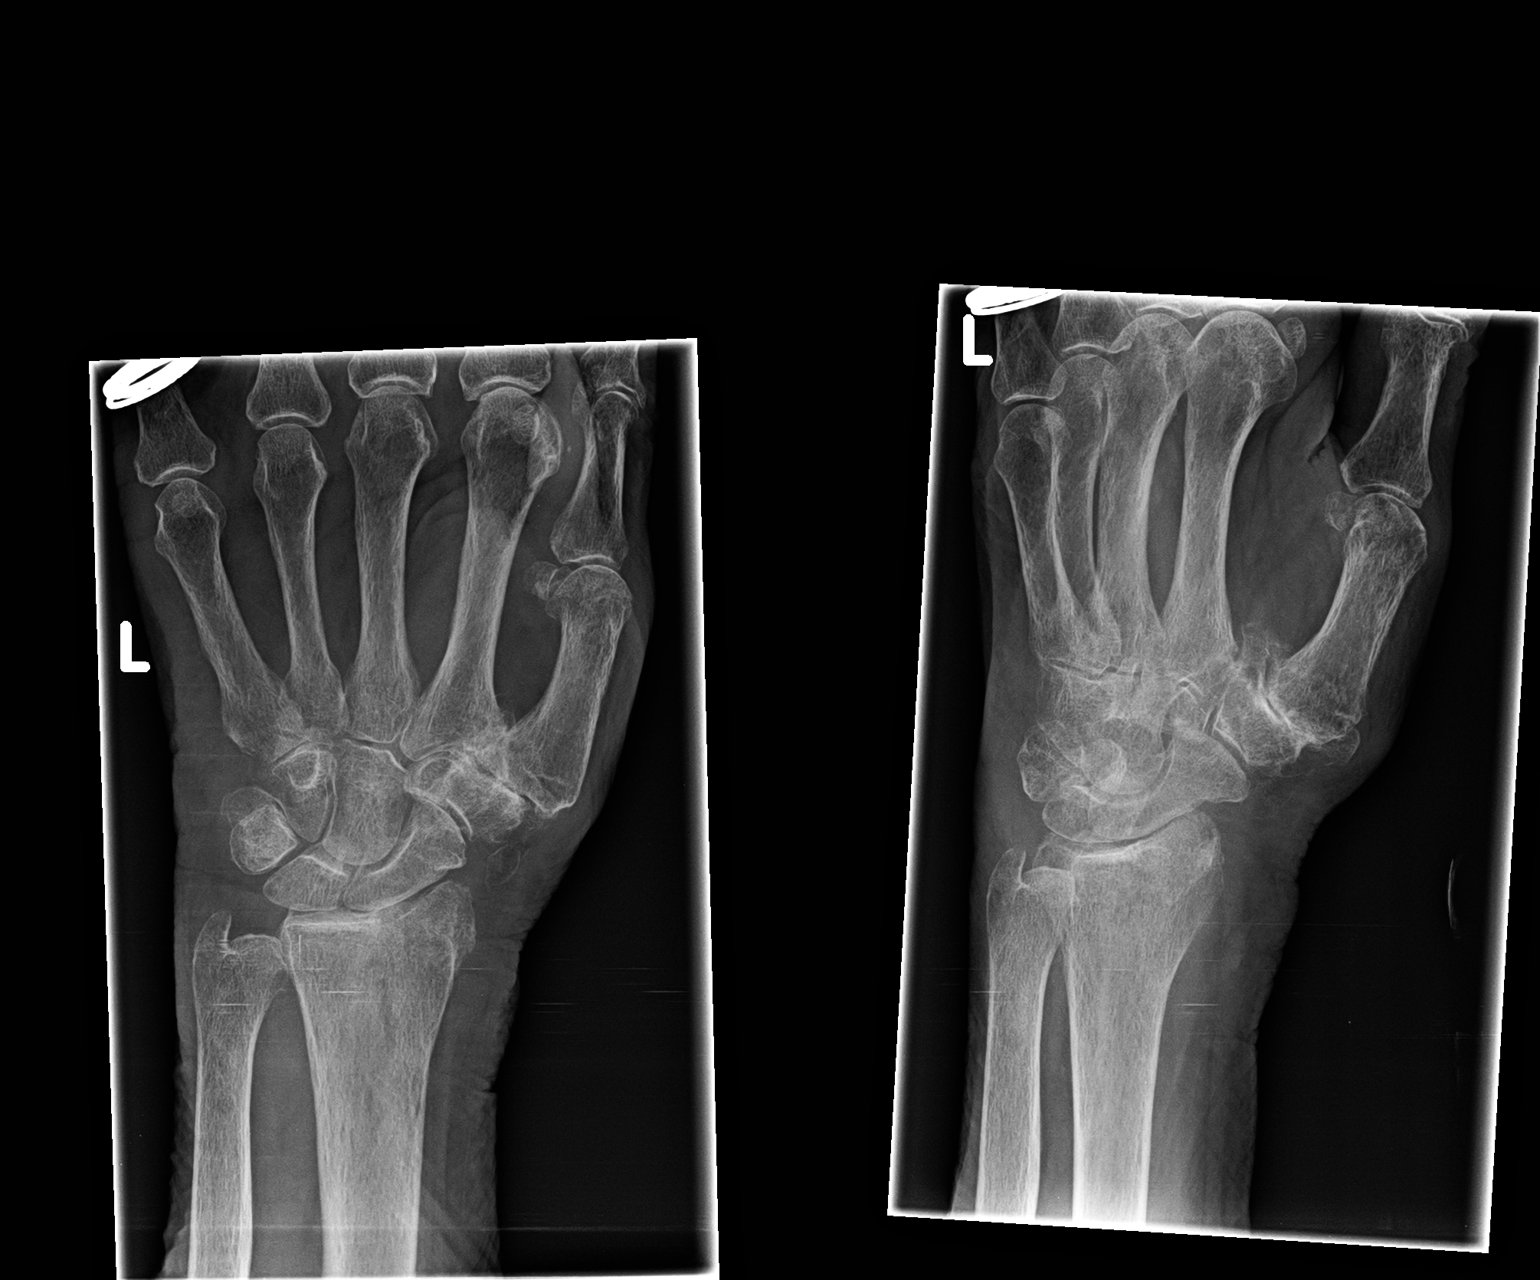

[wrist lat]
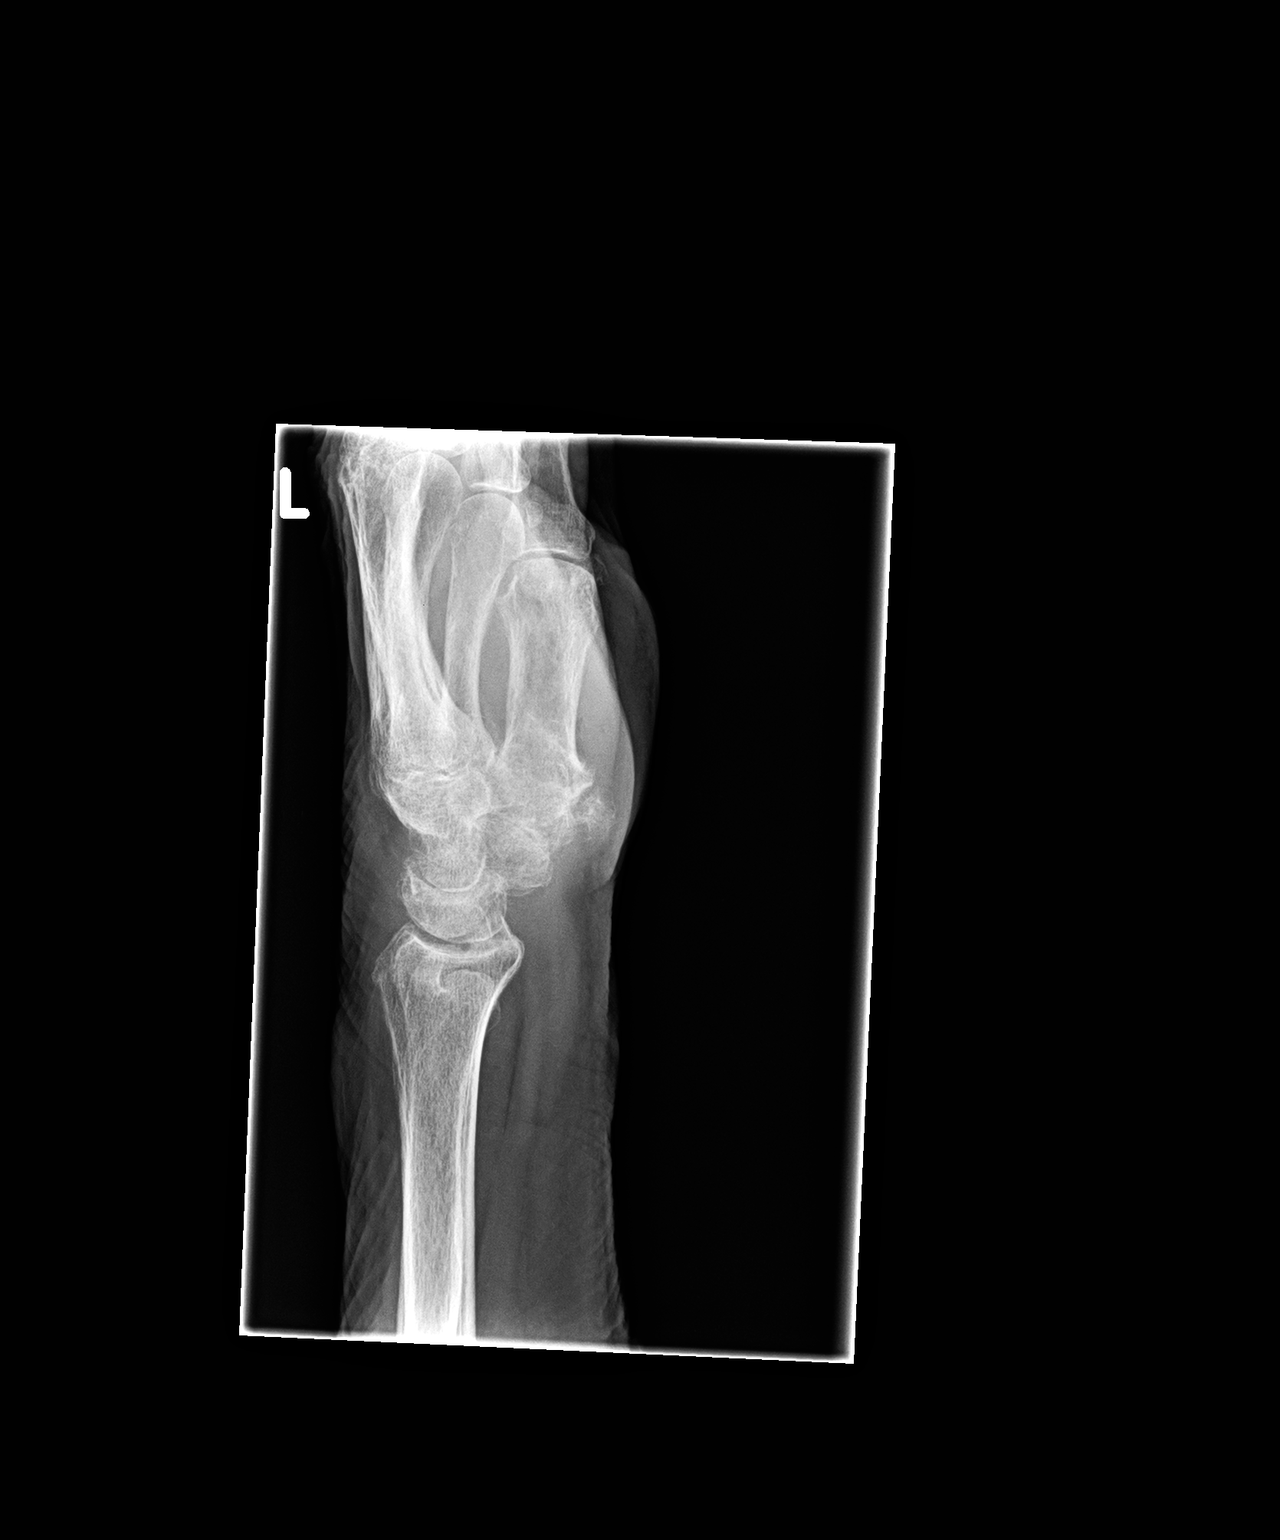

[2 of 2 positions shown; findings below may reference images not displayed]

DIAGNOSTIC STUDIES

EXAM

Left wrist

INDICATION

fracture follow-up
F/U FX X 2 MONTHS AGO

TECHNIQUE

AP lateral and oblique views of the left wrist were obtained.

COMPARISONS

July 29, 2019

FINDINGS

Radial styloid fracture is re-demonstrated. The fracture line is barely visible. No new fractures
are seen. Advanced osteoarthritic changes of the 1st carpometacarpal joint space are noted.

IMPRESSION

Healing radial styloid fracture.

Tech Notes:

F/U FX X 2 MONTHS AGO

## 2020-09-11 ENCOUNTER — Encounter: Admit: 2020-09-11 | Discharge: 2020-09-11 | Payer: MEDICARE

## 2020-09-11 MED ORDER — ANASTROZOLE 1 MG PO TAB
ORAL_TABLET | Freq: Every day | 3 refills
Start: 2020-09-11 — End: ?

## 2020-09-28 NOTE — Patient Instructions
Thank you for coming to see us today.   Please call our office or send a message through MyChart if you have any questions or concerns.                                                                                                                                                           Mary Williams RN, BSN, OCN, CBCN, CN-BN  Clinical Nurse Coordinator for Lori Ranallo, APRN-BC        Breast Cancer Survivorship/High Risk Breast Clinic  Lymphedema Screening & Prevention Program   [phone] (phone)  [phone] (fax)      [facility]   Clinic: Monday, Thursday, Friday   Admin: Tuesday  [address]  [place], [place] [address]  Scheduling: [phone]  Fax: [phone]   [facility] [place]   Clinic: Wednesday  [address]  [place], [place] [address]  Scheduling: [phone] (Tara) or [phone] (Nicki)  Fax: [phone] or [phone]

## 2020-10-01 ENCOUNTER — Encounter: Admit: 2020-10-01 | Discharge: 2020-10-01 | Payer: MEDICARE

## 2020-10-01 DIAGNOSIS — I10 Essential (primary) hypertension: Secondary | ICD-10-CM

## 2020-10-01 DIAGNOSIS — C44319 Basal cell carcinoma of skin of other parts of face: Secondary | ICD-10-CM

## 2020-10-01 DIAGNOSIS — D044 Carcinoma in situ of skin of scalp and neck: Secondary | ICD-10-CM

## 2020-10-01 DIAGNOSIS — N816 Rectocele: Secondary | ICD-10-CM

## 2020-10-01 DIAGNOSIS — Z1231 Encounter for screening mammogram for malignant neoplasm of breast: Secondary | ICD-10-CM

## 2020-10-01 DIAGNOSIS — Z853 Personal history of malignant neoplasm of breast: Secondary | ICD-10-CM

## 2020-10-01 DIAGNOSIS — Z9189 Other specified personal risk factors, not elsewhere classified: Secondary | ICD-10-CM

## 2020-10-01 DIAGNOSIS — C4441 Basal cell carcinoma of skin of scalp and neck: Secondary | ICD-10-CM

## 2020-10-01 DIAGNOSIS — Z780 Asymptomatic menopausal state: Secondary | ICD-10-CM

## 2020-10-01 DIAGNOSIS — C50911 Malignant neoplasm of unspecified site of right female breast: Secondary | ICD-10-CM

## 2020-10-01 DIAGNOSIS — M549 Dorsalgia, unspecified: Secondary | ICD-10-CM

## 2020-10-01 DIAGNOSIS — Z9221 Personal history of antineoplastic chemotherapy: Secondary | ICD-10-CM

## 2020-10-01 DIAGNOSIS — Z79899 Other long term (current) drug therapy: Secondary | ICD-10-CM

## 2020-10-01 DIAGNOSIS — E7800 Pure hypercholesterolemia, unspecified: Secondary | ICD-10-CM

## 2020-10-01 DIAGNOSIS — M85851 Other specified disorders of bone density and structure, right thigh: Secondary | ICD-10-CM

## 2020-10-01 DIAGNOSIS — Z13820 Encounter for screening for osteoporosis: Secondary | ICD-10-CM

## 2020-10-01 DIAGNOSIS — S62102A Fracture of unspecified carpal bone, left wrist, initial encounter for closed fracture: Secondary | ICD-10-CM

## 2020-10-01 DIAGNOSIS — D045 Carcinoma in situ of skin of trunk: Secondary | ICD-10-CM

## 2020-10-01 DIAGNOSIS — D0439 Carcinoma in situ of skin of other parts of face: Secondary | ICD-10-CM

## 2020-10-01 DIAGNOSIS — Z92241 Personal history of systemic steroid therapy: Secondary | ICD-10-CM

## 2020-10-01 DIAGNOSIS — I499 Cardiac arrhythmia, unspecified: Secondary | ICD-10-CM

## 2021-05-04 IMAGING — CR CHEST
2 series · 2 of 2 positions shown · non-contrast
Comparison: none

[chest pa]
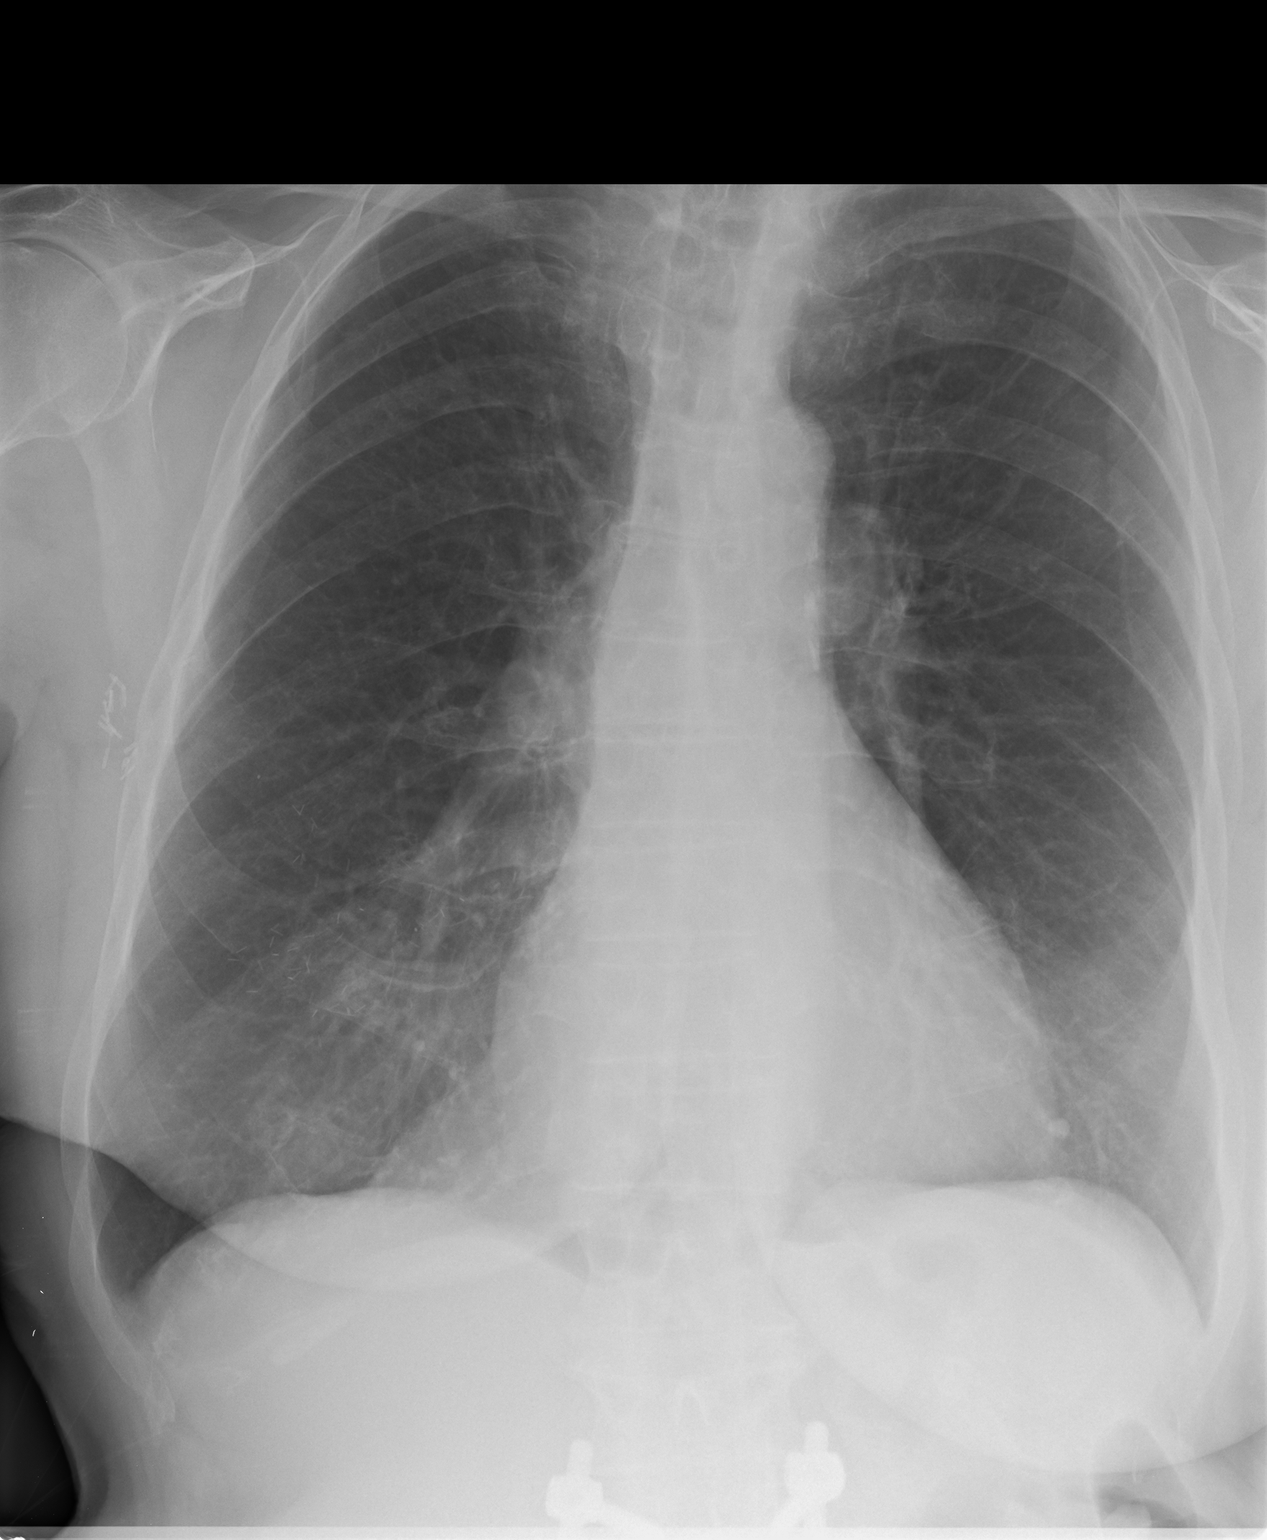

[chest lat]
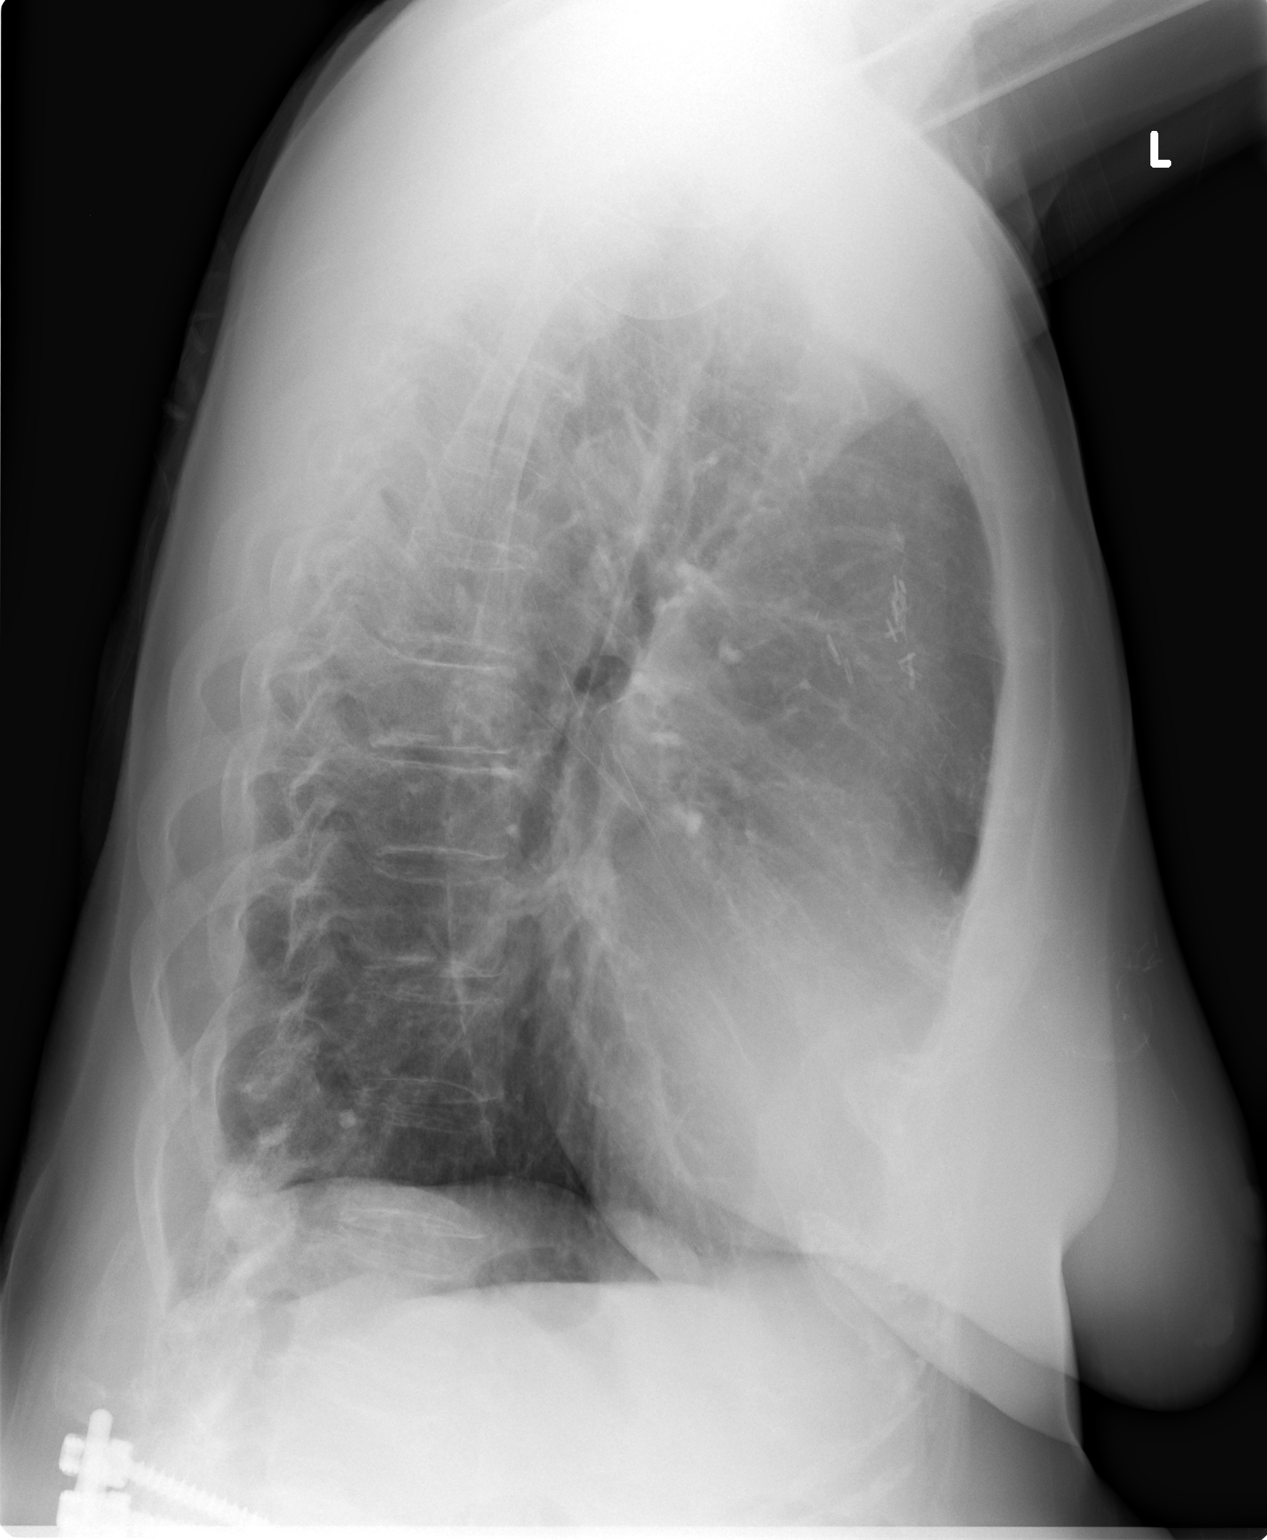

[2 of 2 positions shown; findings below may reference images not displayed]

DIAGNOSTIC STUDIES

EXAM

XR chest 2V

INDICATION

pre-op
PT HAVING SURGERY SOON.  HX. BREAST CANCER 4359.  HX. SMOKER- QUIT IN [REDACTED].  AB

TECHNIQUE

PA and lateral views of the chest were obtained.

COMPARISONS

None available

FINDINGS

Mild cardiomegaly is noted. There is COPD. Scattered granulomas are evident. No acute infiltrates
are seen. Osseous structures are within normal limits.

IMPRESSION

No acute infiltrates throughout the chest. There is mild COPD.

Surgical clips are noted in the right axilla.

Tech Notes:

PT HAVING SURGERY SOON.  HX. BREAST CANCER 4359.  HX. SMOKER- QUIT IN [REDACTED].  AB

## 2021-09-22 ENCOUNTER — Encounter: Admit: 2021-09-22 | Discharge: 2021-09-22 | Payer: MEDICARE

## 2021-09-28 NOTE — Progress Notes
Name: Stephanie Bentley          MRN: [id number]      DOB: [birth year]      AGE: 87 y.o.   DATE OF SERVICE: 09/30/2021    Subjective:             Reason for Visit:  Breast Cancer ([facility])      Stephanie Bentley is a 87 y.o. female.     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 SLNBX (0/4)    Treatment summary: Stephanie Bentley is a very pleasant postmenopausal female who was in her usual state of good health when she appreciated a mass in her right breast (06/2014). She then presented to her PCP for further evaluation. Bilateral diagnostic mammogram 07/10/2014 [facility]) revealed a 3.5 cm bilobed mass in the right breast upper outer quadrant. Scattered calcifications were seen bilaterally. No other suspicious findings were seen. Right breast ultrasound 07/10/2014 [facility]) revealed a 2.7 cm mass at 11:00, 3 cm FTN. Right breast sono-guided biopsy 07/25/2014 [name]) revealed grade 2, estrogen positive, invasive mammary carcinoma; favor lobular.    Stephanie Bentley consulted with Stephanie Bentley and proceeded with a right breast ultrasound-guided lumpectomy, right axillary sentinel lymph node biopsy by Dr. Arva Chafe on 08/29/2014. Pathology demonstrated a 3.5 cm ILC, margins negative, nuclear grade 2, histologic grade II, no LVI, 0/4 lymph nodes (ER100%, PR0%, HER2 0, Ki-67 68%).     She completed adjuvant right breast radiation 09/25/2014-10/20/2014 Corey Harold, [place] in [place]). Dose: 42.56 Gy in 16 daily fractions to the right breast.     Adjuvant endocrine therapy: Arimidex, initiated 10/2014 - 09/2020 ([facility]).    REPRODUCTIVE HISTORY:   Age at first menstrual cycle: 53  Age of first live birth: 55  Number of live births: 5  Number of pregnancies: 5  Age at menopause: 38  Uterus and ovaries intact  No HRT    Family History   Problem Relation Age of Onset   ? None Reported Mother    ? Heart Disease Father    ? None Reported Son    ? None Reported Daughter    ? None Reported Maternal Grandmother    ? None Reported Maternal Grandfather    ? None Reported Paternal Grandmother    ? None Reported Paternal Grandfather    ? None Reported Sister    ? None Reported Sister    ? None Reported Son    ? None Reported Daughter    ? None Reported Daughter    ? Cancer Neg Hx      HISTORY OF PRESENT ILLNESS:   The patient returns to the [facility] for continued  follow-up care. She was referred to my clinic by Dellia Cloud, MD. She has a history of right breast CA (07/25/2014; age 81). S/P lumpectomy/SLNBX on 08/29/2020 (Bentley); pathology showed invasive lobular carcinoma, (3.5 x 3.3 x 3.2 cm), grade 2, ER 100%, PR 0%, HER-2/neu 0+ and Ki-67 68%. SLNBX (0/4). She completed adjuvant right breast radiation 09/25/2014-10/20/2014 Corey Harold, MD in [place]). Dose: 42.56 Gy in 16  daily fractions to the right breast and adjuvant endocrine therapy: Arimidex, initiated 10/2014 - 09/2020 ([facility]). She is feeling well, denies changes in her breasts at this time.       Review of Systems  No unusual fatigue or distress at this visit.   Widowed; [place]   Retired from [employer] (35 years).   5 children; 10 grandchildren and 16 great grandchildren.   Weight (BMI 25.81; # 174 <- 28.43; # 192)  Poor appetite while in rehabilitation with right femur fracture (05/2021) and COVID infection (05/2021).   Anastrozole (2015 - 2021)  Genetic testing: none  Family history pedigree reviewed at this visit.   No current cancer therapy.   COVID vaccination completed (11/26/2019 and 12/24/2019; Moderna).   COVID booster (08/20/2020; Moderna).  COVID bivalent booster (05/24/2021; Pfizer).   COVID infection 05/2021.    No fever or chills.   No headache or dizziness.   No SOB or cough.   No chest pain or palpations.  No history of RUE lymphedema (SLNBX 0/4)  Current therapy for HTN, hyperlipidemia.  No family history of cardiac disease.   Patient denies nipple discharge, skin nodules or lumps in the breasts at this time.   Chronic back pain. Left wrist fracture 08/18/2018.   Right femoral fracture 05/21/2021; required surgery (rods/pins)  No nausea, vomiting, diarrhea. Chronic constipation.   No dysuria or hematochezia.   Dr. Celedonio Savage; urinary incontinence; S/P stimulator and sling.   Colonoscopy (many years ago).   No FH of colon cancer  Uterus and ovaries intact.   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   No current rash, ulcerations of the skin or changing moles.   No FH of melanoma  Patient  reports that she quit smoking about 30 years ago. Her smoking use included cigarettes. She has a 40.00 pack-year smoking history. She has never used smokeless tobacco.  Patient  reports current alcohol use.  Current exercise: tries to stay active.   No anxiety or depression    Objective:         ? acetaminophen (TYLENOL) 500 mg tablet Take 1,000 mg by mouth as Needed for Pain.   ? CALCIUM PO Take 1,200 mg by mouth.   ? Cholecalciferol (Vitamin D3) 25 mcg (1,000 unit) cap Take 3,000 Units by mouth twice daily.   ? folic acid/multivit-min/lutein (CENTRUM SILVER PO) Take  by mouth.   ? glucosamine(+) 500 mg tab Take 1,000 mg by mouth twice daily.   ? ibuprofen (MOTRIN) 200 mg tablet Take 400 mg by mouth as Needed for Pain.   ? lactobacillus combination no.4 (SENIOR PROBIOTIC PO) Take  by mouth.   ? losartan (COZAAR) 50 mg tablet Take 50 mg by mouth daily.   ? metoprolol (LOPRESSOR) 25 mg tablet Take 12.5 mg by mouth daily.   ? NAPROXEN SODIUM (ALEVE PO) Take  by mouth as Needed.   ? polyethylene glycol 3350 (MIRALAX) 17 g packet Take 17 g by mouth daily.   ? psyllium (METAMUCIL) 3.4 gram packet Take  by mouth twice daily.   ? vitamins, multiple tablet Take 1 Tab by mouth daily.     Vitals:    09/30/21 1306 09/30/21 1317   BP:  (!) 149/67   BP Source:  Arm, Left Upper   Pulse:  85   Temp:  36.4 ?C (97.5 ?F)   Resp:  16  SpO2:  95%   TempSrc:  Oral   PainSc: Zero    Weight:  79.3 kg (174 lb 12.8 oz)   Height:  175.3 cm (5' 9)     Body mass index is 25.81 kg/m?Marland Kitchen        Fatigue Scale: 0-None    Pain Addressed:  N/A    Patient Evaluated for a Clinical Trial: Patient not eligible for a treatment trial (including not needing treatment, needs palliative care, in remission).     [place] Cooperative Oncology Group performance status is 1, Restricted in physically strenuous activity but ambulatory and able to carry out work of a light or sedentary nature, e.g., light house work, office work.     Physical Exam  Vitals reviewed.   Chest:       Lymphadenopathy:      Cervical: No cervical adenopathy.      Upper Body:      Right upper body: No supraclavicular or axillary adenopathy.      Left upper body: No supraclavicular or axillary adenopathy.        This is a 87 year old female in no acute distress, well developed.   Presents in a wheelchair with her daughter Stephanie Bentley).    Regularly uses a cane.   HEENT: No icterus.   Neck: No JVD, supple.   Chest: CTA bilaterally.   CV: Irregular heart rhythm.   Abdomen: Soft, non-distended, non-tender, positive bowel sounds, no organomegaly.   Skin: No rash or suspicious appearing moles  Extremities: No clubbing, cyanosis or edema.   Back: No pain with palpation or percussion.   No evidence of RUE lymphedema (SLNBX 0/4)  Neuro: CN II-XII grossly intact; no sensory or motor abnormalities noted.           BONE HEALTH:   04/12/2019 BMD: Low bone mass (osteopenia).   FRAX SCORE   10 year risk hip fracture = 14.5%   10 year risk major osteoporotic fracture = 24.1%   09/30/2021 BMD: Stable bone mineral density of upper lumbar spine with increase in bone mineral density of the left hip. The present findings are compatible with borderline low bone mass (osteopenia).     BREAST IMAGING:   05/18/2019 BILATERAL MAMMOGRAM (TOMO): ASSESSMENT: BIRAD 2-Benign   10/01/2020 BILATERAL MAMMOGRAM (TOMO):  ASSESSMENT: BIRAD 2-Benign   09/30/2021 BILATERAL MAMMOGRAM (TOMO): ASSESSMENT: BIRAD 1-Negative          Assessment and Plan:  1. Right breast CA (07/2014; age 34). ILC, grade 2, ER positive, PR negative. S/P lumpectomy/SLNBX (0/4). Completed adjuvant radiation therapy (10/2014) and adjuvant endocrine therapy (anastrozole) will complete with current prescription (09/2020).   2. History of Vitamin D deficiency.   3. Osteopenia. Last BMD 03/2019; high FRAX score. Repeat 09/2021. Osteopenia.   4. Discussed genetic testing. This patient is at risk for developing HER-2 negative metastatic disease. Given recent approval of PARP inhibitor for BRCA mutated HER2 negative metastatic breast cancer, we discussed germline genetic testing with patient today. We discussed the role of genetic testing to identify a germline mutation in BRCA1 or BRCA2 which would qualify her for future treatment with a PARP inhibitor if needed. The BRCA CDx is FDA approved for genetic testing to qualify for PARP inhibitor treatment. At present, we have access to  BRCA1 and BRCA2 testing in the [facility] CDx panel as well as option for reflex panel testing for other cancer related genes in the [facility] panel if CDx is negative which could alter her management  for other cancer screening. This comprehensive MyRisk panel provides information on clinically actionable and clinically inactionable gene mutations. At present, it is likely that at least 30% of test run will identify variants of uncertain significance, which will require management decisions to be based off of personal and family history of cancer. We discussed the likelihood is still higher that she does not have a genetic mutation. Discussed the potential results including deleterious mutation, no mutation identified and variants of unknown significance. The patient was provided with information on genetic testing and [facility] and the risk associated with moderate/high penetrance gene mutations. She acknowledged understanding of the potential results including positive, negative and variants of uncertain significance, and had blood drawn and shipped to [facility]. Results may take up to 3-4 weeks and the patient and her referring providers will be contacted at that time for results disclosure and next steps.  5. Reviewed signs to watch for that could indicate a local or distant recurrence. Patient will call our office with any new signs.   Local recurrence  Signs and symptoms of local recurrence within the same breast may include:   ? A new lump in your breast or irregular area of firmness   ? A new thickening in your breast area   ? A new pulling back of the skin or dimpling at the lumpectomy site   ? Skin inflammation or area of redness   ? Flattening or indentation of your nipple or other nipple changes  Regional recurrence   A regional breast cancer recurrence means the cancer has come back in the lymph nodes in your armpit or collarbone area. Signs and symptoms of regional recurrence may include:   ? A lump or swelling in the lymph nodes under your arm or in the groove above your collarbone   ? Swelling of your arm (this could be related to lymphedema or even a blood clot)  ? Persistent pain in your arm and shoulder   ? Increasing loss of sensation in your arm and hand  Distant (metastatic) recurrence   A distant, or metastatic, recurrence means the cancer has traveled to distant parts of the body, most commonly the bones, liver and lungs. The signs and symptoms may include:   ? Pain, such as chest or bone pain   ? Persistent, dry cough   ? Difficulty breathing   ? Loss of appetite   ? Persistent nausea, vomiting or weight loss   ? Swelling in the abdomen  ? Severe headaches  When to call our office   You know your body best -- what feels normal and what doesn't. It's important to be aware of the signs and symptoms of recurrent breast cancer, such as:   ? New and persistent pain   ? Changes or new lumps in your breast, surgical scar    ? Unexplained weight loss   ? Shortness of breath  If you experience any signs and symptoms that might suggest a recurrence, call our office. Patient verbalized understanding and phone numbers provided.    6. Optional screening due to age. RTC one year with bilateral mammogram.    Total Time Today was 30 minutes in the following activities: Preparing to see the patient, Obtaining and/or reviewing separately obtained history, Performing a medically appropriate examination and/or evaluation, Counseling and educating the patient/family/caregiver, Ordering medications, tests, or procedures, Referring and communication with other health care professionals (when not separately reported), Documenting clinical information in the electronic or other health  record, Independently interpreting results (not separately reported) and communicating results to the patient/family/caregiver and Care coordination (not separately reported)    Ernie Hew, APRN-BC, CBCN  Nurse Practitioner  Collaborating physician: Dellia Cloud, MD  NPI # [id number]      Sharia Reeve, APRN-NP

## 2021-09-29 ENCOUNTER — Encounter: Admit: 2021-09-29 | Discharge: 2021-09-29 | Payer: MEDICARE

## 2021-09-29 DIAGNOSIS — N816 Rectocele: Secondary | ICD-10-CM

## 2021-09-29 DIAGNOSIS — C50911 Malignant neoplasm of unspecified site of right female breast: Secondary | ICD-10-CM

## 2021-09-29 DIAGNOSIS — D044 Carcinoma in situ of skin of scalp and neck: Secondary | ICD-10-CM

## 2021-09-29 DIAGNOSIS — D045 Carcinoma in situ of skin of trunk: Secondary | ICD-10-CM

## 2021-09-29 DIAGNOSIS — I499 Cardiac arrhythmia, unspecified: Secondary | ICD-10-CM

## 2021-09-29 DIAGNOSIS — E7800 Pure hypercholesterolemia, unspecified: Secondary | ICD-10-CM

## 2021-09-29 DIAGNOSIS — Z92241 Personal history of systemic steroid therapy: Secondary | ICD-10-CM

## 2021-09-29 DIAGNOSIS — M549 Dorsalgia, unspecified: Secondary | ICD-10-CM

## 2021-09-29 DIAGNOSIS — I10 Essential (primary) hypertension: Secondary | ICD-10-CM

## 2021-09-29 DIAGNOSIS — S62102A Fracture of unspecified carpal bone, left wrist, initial encounter for closed fracture: Secondary | ICD-10-CM

## 2021-09-29 DIAGNOSIS — D0439 Carcinoma in situ of skin of other parts of face: Secondary | ICD-10-CM

## 2021-09-29 DIAGNOSIS — S7291XA Unspecified fracture of right femur, initial encounter for closed fracture: Secondary | ICD-10-CM

## 2021-09-29 DIAGNOSIS — C44319 Basal cell carcinoma of skin of other parts of face: Secondary | ICD-10-CM

## 2021-09-29 DIAGNOSIS — C4441 Basal cell carcinoma of skin of scalp and neck: Secondary | ICD-10-CM

## 2021-09-30 ENCOUNTER — Ambulatory Visit: Admit: 2021-09-30 | Discharge: 2021-09-30 | Payer: MEDICARE

## 2021-09-30 ENCOUNTER — Encounter: Admit: 2021-09-30 | Discharge: 2021-09-30 | Payer: MEDICARE

## 2021-09-30 DIAGNOSIS — Z853 Personal history of malignant neoplasm of breast: Secondary | ICD-10-CM

## 2021-09-30 DIAGNOSIS — Z780 Asymptomatic menopausal state: Secondary | ICD-10-CM

## 2021-09-30 DIAGNOSIS — E7800 Pure hypercholesterolemia, unspecified: Secondary | ICD-10-CM

## 2021-09-30 DIAGNOSIS — Z13820 Encounter for screening for osteoporosis: Secondary | ICD-10-CM

## 2021-09-30 DIAGNOSIS — Z1379 Encounter for other screening for genetic and chromosomal anomalies: Secondary | ICD-10-CM

## 2021-09-30 DIAGNOSIS — S62102A Fracture of unspecified carpal bone, left wrist, initial encounter for closed fracture: Secondary | ICD-10-CM

## 2021-09-30 DIAGNOSIS — Z9189 Other specified personal risk factors, not elsewhere classified: Secondary | ICD-10-CM

## 2021-09-30 DIAGNOSIS — Z92241 Personal history of systemic steroid therapy: Secondary | ICD-10-CM

## 2021-09-30 DIAGNOSIS — Z9221 Personal history of antineoplastic chemotherapy: Secondary | ICD-10-CM

## 2021-09-30 DIAGNOSIS — Z1231 Encounter for screening mammogram for malignant neoplasm of breast: Secondary | ICD-10-CM

## 2021-09-30 DIAGNOSIS — D044 Carcinoma in situ of skin of scalp and neck: Secondary | ICD-10-CM

## 2021-09-30 DIAGNOSIS — M549 Dorsalgia, unspecified: Secondary | ICD-10-CM

## 2021-09-30 DIAGNOSIS — I499 Cardiac arrhythmia, unspecified: Secondary | ICD-10-CM

## 2021-09-30 DIAGNOSIS — Z79899 Other long term (current) drug therapy: Secondary | ICD-10-CM

## 2021-09-30 DIAGNOSIS — D045 Carcinoma in situ of skin of trunk: Secondary | ICD-10-CM

## 2021-09-30 DIAGNOSIS — C50911 Malignant neoplasm of unspecified site of right female breast: Secondary | ICD-10-CM

## 2021-09-30 DIAGNOSIS — N816 Rectocele: Secondary | ICD-10-CM

## 2021-09-30 DIAGNOSIS — I10 Essential (primary) hypertension: Secondary | ICD-10-CM

## 2021-09-30 DIAGNOSIS — C44319 Basal cell carcinoma of skin of other parts of face: Secondary | ICD-10-CM

## 2021-09-30 DIAGNOSIS — S7291XA Unspecified fracture of right femur, initial encounter for closed fracture: Secondary | ICD-10-CM

## 2021-09-30 DIAGNOSIS — M85851 Other specified disorders of bone density and structure, right thigh: Secondary | ICD-10-CM

## 2021-09-30 DIAGNOSIS — C4441 Basal cell carcinoma of skin of scalp and neck: Secondary | ICD-10-CM

## 2021-09-30 DIAGNOSIS — D0439 Carcinoma in situ of skin of other parts of face: Secondary | ICD-10-CM

## 2021-09-30 LAB — 25-OH VITAMIN D (D2 + D3): VITAMIN D (25-OH) TOTAL: 59 ng/mL (ref 30–80)

## 2021-09-30 NOTE — Patient Instructions
Posey Pronto,     Thank you for coming to see [place] today.   Please call our office or send a message through [place] if you have any questions or concerns.                                                                                                                                                           Sheppard Coil RN, BSN, OCN, CBCN, CN-BN  Clinical Nurse Coordinator for Tona Sensing, APRN-BC        [facility]   [phone] (nurse phone)  [phone] ([place] or [place] 24/7)  [phone] (fax)      [facility]: Monday, Thursday, Friday   Admin: Tuesday  [address]  [place], [place] [address]  Scheduling: [phone]  Fax: [phone]   [facility]: Wednesday  [address]  [place], [place] [address]  Scheduling: [phone] Sherron Flemings Raeanne Barry)                        [phone] [facility])                        [phone] Boris Lown)  Fax: [phone] or [phone]

## 2021-09-30 NOTE — Progress Notes
Normal mammogram results released to MyChart and routed to referring physician as requested.

## 2021-09-30 NOTE — Progress Notes
Normal or within an acceptable range.

## 2021-10-21 ENCOUNTER — Encounter: Admit: 2021-10-21 | Discharge: 2021-10-21 | Payer: MEDICARE

## 2021-10-21 DIAGNOSIS — I10 Essential (primary) hypertension: Secondary | ICD-10-CM

## 2021-10-21 DIAGNOSIS — C44319 Basal cell carcinoma of skin of other parts of face: Secondary | ICD-10-CM

## 2021-10-21 DIAGNOSIS — I499 Cardiac arrhythmia, unspecified: Secondary | ICD-10-CM

## 2021-10-21 DIAGNOSIS — M549 Dorsalgia, unspecified: Secondary | ICD-10-CM

## 2021-10-21 DIAGNOSIS — Z1379 Encounter for other screening for genetic and chromosomal anomalies: Secondary | ICD-10-CM

## 2021-10-21 DIAGNOSIS — D0439 Carcinoma in situ of skin of other parts of face: Secondary | ICD-10-CM

## 2021-10-21 DIAGNOSIS — D045 Carcinoma in situ of skin of trunk: Secondary | ICD-10-CM

## 2021-10-21 DIAGNOSIS — C4441 Basal cell carcinoma of skin of scalp and neck: Secondary | ICD-10-CM

## 2021-10-21 DIAGNOSIS — E7800 Pure hypercholesterolemia, unspecified: Secondary | ICD-10-CM

## 2021-10-21 DIAGNOSIS — D044 Carcinoma in situ of skin of scalp and neck: Secondary | ICD-10-CM

## 2021-10-21 DIAGNOSIS — S7291XA Unspecified fracture of right femur, initial encounter for closed fracture: Secondary | ICD-10-CM

## 2021-10-21 DIAGNOSIS — N816 Rectocele: Secondary | ICD-10-CM

## 2021-10-21 DIAGNOSIS — Z92241 Personal history of systemic steroid therapy: Secondary | ICD-10-CM

## 2021-10-21 DIAGNOSIS — C50911 Malignant neoplasm of unspecified site of right female breast: Secondary | ICD-10-CM

## 2021-10-21 DIAGNOSIS — S62102A Fracture of unspecified carpal bone, left wrist, initial encounter for closed fracture: Secondary | ICD-10-CM

## 2022-09-15 ENCOUNTER — Encounter: Admit: 2022-09-15 | Discharge: 2022-09-15 | Payer: MEDICARE

## 2022-10-18 ENCOUNTER — Encounter: Admit: 2022-10-18 | Discharge: 2022-10-18 | Payer: MEDICARE

## 2022-10-24 ENCOUNTER — Encounter: Admit: 2022-10-24 | Discharge: 2022-10-24 | Payer: MEDICARE

## 2023-01-23 ENCOUNTER — Encounter: Admit: 2023-01-23 | Discharge: 2023-01-23 | Payer: MEDICARE

## 2023-02-17 ENCOUNTER — Encounter: Admit: 2023-02-17 | Discharge: 2023-02-17 | Payer: MEDICARE

## 2023-02-17 NOTE — Progress Notes
Name: Stephanie Bentley          MRN: [id number]      DOB: September 03, 1932      AGE: 89 y.o.   DATE OF SERVICE: 02/23/2023    Subjective:             Reason for Visit:  Breast Cancer (SVR)    Stephanie Bentley is a 89 y.o. female.      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 SLNBX (0/4)    Treatment summary: Stephanie Bentley is a very pleasant postmenopausal female who was in her usual state of good health when she appreciated a mass in her right breast (06/2014). She then presented to her PCP for further evaluation. Bilateral diagnostic mammogram 07/10/2014 [facility]) revealed a 3.5 cm bilobed mass in the right breast upper outer quadrant. Scattered calcifications were seen bilaterally. No other suspicious findings were seen. Right breast ultrasound 07/10/2014 [facility]) revealed a 2.7 cm mass at 11:00, 3 cm FTN. Right breast sono-guided biopsy 07/25/2014 [name]) revealed grade 2, estrogen positive, invasive mammary carcinoma; favor lobular.    Braydee consulted with Loretha Stapler McGinness and proceeded with a right breast ultrasound-guided lumpectomy, right axillary sentinel lymph node biopsy by Dr. Arva Chafe on 08/29/2014. Pathology demonstrated a 3.5 cm ILC, margins negative, nuclear grade 2, histologic grade II, no LVI, 0/4 lymph nodes (ER100%, PR0%, HER2 0, Ki-67 68%).     She completed adjuvant right breast radiation 09/25/2014-10/20/2014 Corey Harold, MD in [place]). Dose: 42.56 Gy in 16 daily fractions to the right breast.     Adjuvant endocrine therapy: Arimidex, initiated 10/2014 - 09/2020 ([facility]).    REPRODUCTIVE HISTORY:   Age at first menstrual cycle: 27  Age of first live birth: 21  Number of live births: 5  Number of pregnancies: 5  Age at menopause: 80  Uterus and ovaries intact  No HRT    Family History   Problem Relation Age of Onset    None Reported Mother     Heart Disease Father     None Reported Son     None Reported Daughter     None Reported Maternal Grandmother     None Reported Maternal Grandfather     None Reported Paternal Grandmother     None Reported Paternal Grandfather     None Reported Sister     None Reported Sister     None Reported Son     None Reported Daughter     None Reported Daughter     Cancer Neg Hx      Genetic testing:   09/30/2021 [facility] myRisk Hereditary Cancer Test:  Negative, no mutation or VUS.     HISTORY OF PRESENT ILLNESS:   The patient returns to the Breast Cancer Survivorship Clinic for continued  follow-up care. She was referred to my clinic by Dellia Cloud, MD. She has a history of right breast CA (07/25/2014; age 3). S/P lumpectomy/SLNBX on 08/29/2020 (McGinness); pathology showed invasive lobular carcinoma, (3.5 x 3.3 x 3.2 cm), grade 2, ER 100%, PR 0%, HER-2/neu 0+ and Ki-67  68%. SLNBX (0/4). She completed adjuvant right breast radiation 09/25/2014-10/20/2014 Corey Harold, MD in [place]). Dose: 42.56 Gy in 16 daily fractions to the right breast and adjuvant endocrine therapy: Arimidex, initiated 10/2014 - 09/2020 ([facility]). She is feeling well, denies changes in her breasts at this time.         Review of Systems  No unusual fatigue or distress at this visit.   Widowed; [place]. Still dives and lives independently.   Retired from [employer] (35 years).   5 children; 10 grandchildren and 16 great grandchildren.   Weight (BMI 25.81; # 174 <- 28.43; # 192)  Poor appetite while in rehabilitation with right femur fracture (05/2021) and COVID infection (05/2021).   Anastrozole (2015 - 2021)  Genetic testing: none  09/30/2021 My[facility] myRisk Hereditary Cancer Test:  Negative, no mutation or VUS.   Family history pedigree reviewed at this visit.   No current cancer therapy.   COVID vaccination completed (11/26/2019 and 12/24/2019; Moderna).   COVID booster (08/20/2020; Moderna).  COVID bivalent booster (05/24/2021; Pfizer).   COVID infection 05/2021.    No fever or chills.   No headache or dizziness.   Neck pain (11/2022) went to the ED with x-rays (negative per patient and daughter). Completed PT.   No SOB with exertion. No cough.   No chest pain or palpations.  No history of RUE lymphedema (SLNBX 0/4)  Current therapy for HTN, hyperlipidemia.  No family history of cardiac disease.   Patient denies nipple discharge, skin nodules or lumps in the breasts at this time.   Chronic back pain. Left wrist fracture 08/18/2018.   Right femoral fracture 05/21/2021; required surgery (rods/pins)  No nausea, vomiting, diarrhea. Chronic constipation.   No dysuria or hematochezia.   Dr. Celedonio Savage; urinary incontinence; S/P stimulator and sling.   Colonoscopy (many years ago; polyps and rectocele).   No FH of colon cancer  Uterus and ovaries intact.   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   No current rash, ulcerations of the skin or changing moles.   No FH of melanoma  Patient  reports that she quit smoking about 31 years ago. Her smoking use included cigarettes. She started smoking about 71 years ago. She has a 40 pack-year smoking history. She has never used smokeless tobacco.  Patient  reports current alcohol use.  Current exercise: tries to stay active.   No anxiety or depression    Objective:          acetaminophen (TYLENOL) 500 mg tablet Take 1,000 mg by mouth as Needed for Pain.    ascorbic acid (VITAMIN C PO) Take  by mouth twice daily.    CALCIUM PO Take 1,200 mg by mouth.    cephalexin (KEFLEX) 250 mg capsule Take one capsule by mouth four times daily.    Cholecalciferol (Vitamin D3) 25 mcg (1,000 unit) cap Take 3,000 Units by mouth twice daily.    folic acid/multivit-min/lutein (CENTRUM SILVER PO) Take  by mouth.    lactobacillus combination no.4 (SENIOR PROBIOTIC PO) Take  by mouth.    losartan (COZAAR) 50 mg tablet Take 50 mg by mouth daily.    Lutein 20 mg cap Take one capsule by mouth.    magnesium oxide (MAGOX) 400 mg (241.3 mg magnesium) tablet Take 250 mg by mouth daily.    metoprolol (LOPRESSOR) 25 mg tablet Take 12.5 mg by mouth  daily.    NAPROXEN SODIUM (ALEVE PO) Take  by mouth as Needed.    polyethylene glycol 3350 (MIRALAX) 17 g packet Take 17 g by mouth daily.    tolterodine LA (DETROL LA) 4 mg capsule Take one capsule by mouth daily.     Vitals:    02/23/23 1004   BP: 122/71   BP Source: Arm, Left Upper   Pulse: 80   Temp: 36.4 ?C (97.5 ?F)   SpO2: 92%   TempSrc: Oral   PainSc: Five   Weight: 79.1 kg (174 lb 6.4 oz)       Body mass index is 25.75 kg/m?Marland Kitchen   Pain Loc: Neck; Arthritis noted on X-ray per patient. Continues with PT.     Fatigue Scale: 6    Pain Addressed:  N/A    Patient Evaluated for a Clinical Trial: Patient not eligible for a treatment trial (including not needing treatment, needs palliative care, in remission).     [place] Cooperative Oncology Group performance status is 1, Restricted in physically strenuous activity but ambulatory and able to carry out work of a light or sedentary nature, e.g., light house work, office work.     Physical Exam  Vitals reviewed.   Chest:       Lymphadenopathy:      Cervical: No cervical adenopathy.      Upper Body:      Right upper body: No supraclavicular or axillary adenopathy.      Left upper body: No supraclavicular or axillary adenopathy.        This is a 89 year old female in no acute distress, well developed.   Presents in a wheelchair with her daughter Earlean Shawl).    Regularly uses a cane.   09/30/2021 Myriad myRisk Hereditary Cancer Test:  Negative, no mutation or VUS.   HEENT: No icterus.   Neck: No JVD, supple.   Chest: CTA bilaterally.   CV: Irregular heart rhythm.   Abdomen: Soft, non-distended, non-tender, positive bowel sounds, no organomegaly.   Skin: No rash or suspicious appearing moles  Extremities: No clubbing, cyanosis or edema.   Back: No pain with palpation or percussion.   No evidence of RUE lymphedema (SLNBX 0/4)  Neuro: CN II-XII grossly intact; no sensory or motor abnormalities noted.           BONE HEALTH:   04/12/2019 BMD: Low bone mass (osteopenia).   FRAX SCORE   10 year risk hip fracture = 14.5%   10 year risk major osteoporotic fracture = 24.1%   09/30/2021 BMD: Stable bone mineral density of upper lumbar spine with increase in bone mineral density of the left hip. The present findings are compatible with borderline low bone mass (osteopenia).     BREAST IMAGING:   05/18/2019 BILATERAL MAMMOGRAM (TOMO): ASSESSMENT: BIRAD 2-Benign   10/01/2020 BILATERAL MAMMOGRAM (TOMO):  ASSESSMENT: BIRAD 2-Benign   09/30/2021 BILATERAL MAMMOGRAM (TOMO): ASSESSMENT: BIRAD 1-Negative   02/23/2023 BILATERAL MAMMOGRAM (TOMO): ASSESSMENT:   Overall: 0 - Incomplete: Needs Additional Imaging Evaluation   RECOMMENDATION:   Diagnostic Mammogram - Left    Ultrasound - Left    ASSESSMENT:   Left: 4 - Suspicious   Overall: 4 - Suspicious   RECOMMENDATION:  Ultrasound - Guided Breast Biopsy - Left     Left US guided biopsy is scheduled for 03/10/2023.          Assessment and Plan:  1. Right breast CA (07/2014; age 85). ILC, grade 2, ER positive, PR negative.  S/P lumpectomy/SLNBX (0/4). Completed adjuvant radiation therapy (10/2014) and adjuvant endocrine therapy (anastrozole) will complete with current prescription (09/2020).   2. History of Vitamin D deficiency.   3. Osteopenia. Last BMD 03/2019; high FRAX score. Repeat 09/2021. Osteopenia.   4. 09/30/2021 Myriad myRisk Hereditary Cancer Test:  Negative, no mutation or VUS.   5. Reviewed signs to watch for that could indicate a local or distant recurrence. Patient will call our office with any new signs.   Local recurrence  Signs and symptoms of local recurrence within the same breast may include:   A new lump in your breast or irregular area of firmness   A new thickening in your breast area   A new pulling back of the skin or dimpling at the lumpectomy site   Skin inflammation or area of redness   Flattening or indentation of your nipple or other nipple changes  Regional recurrence   A regional breast cancer recurrence means the cancer has come back in the lymph nodes in your armpit or collarbone area. Signs and symptoms of regional recurrence may include:   A lump or swelling in the lymph nodes under your arm or in the groove above your collarbone   Swelling of your arm (this could be related to lymphedema or even a blood clot)  Persistent pain in your arm and shoulder   Increasing loss of sensation in your arm and hand  Distant (metastatic) recurrence   A distant, or metastatic, recurrence means the cancer has traveled to distant parts of the body, most commonly the bones, liver and lungs. The signs and symptoms may include:   Pain, such as chest or bone pain   Persistent, dry cough   Difficulty breathing   Loss of appetite   Persistent nausea, vomiting or weight loss   Swelling in the abdomen  Severe headaches  When to call our office   You know your body best -- what feels normal and what doesn't. It's important to be aware of the signs and symptoms of recurrent breast cancer, such as:   New and persistent pain   Changes or new lumps in your breast, surgical scar    Unexplained weight loss   Shortness of breath  If you experience any signs and symptoms that might suggest a recurrence, call our office. Patient verbalized understanding and phone numbers provided.    6. If biopsy is negative; RTC one year with mammogram..    Total Time Today was 40 minutes in the following activities: Preparing to see the patient, Obtaining and/or reviewing separately obtained history, Performing a medically appropriate examination and/or evaluation, Counseling and educating the patient/family/caregiver, Ordering medications, tests, or procedures, Referring and communication with other health care professionals (when not separately reported), Documenting clinical information in the electronic or other health record, Independently interpreting results (not separately reported) and communicating results to the patient/family/caregiver, and Care coordination (not separately reported)      Ernie Hew, APRN-BC, CBCN  Nurse Practitioner  Collaborating physician: Dellia Cloud, MD  NPI # [id number]      Sharia Reeve, APRN-NP

## 2023-02-23 ENCOUNTER — Encounter: Admit: 2023-02-23 | Discharge: 2023-02-23 | Payer: MEDICARE

## 2023-02-23 DIAGNOSIS — I499 Cardiac arrhythmia, unspecified: Secondary | ICD-10-CM

## 2023-02-23 DIAGNOSIS — D0439 Carcinoma in situ of skin of other parts of face: Secondary | ICD-10-CM

## 2023-02-23 DIAGNOSIS — Z08 Encounter for follow-up examination after completed treatment for malignant neoplasm: Secondary | ICD-10-CM

## 2023-02-23 DIAGNOSIS — Z1231 Encounter for screening mammogram for malignant neoplasm of breast: Secondary | ICD-10-CM

## 2023-02-23 DIAGNOSIS — R928 Other abnormal and inconclusive findings on diagnostic imaging of breast: Secondary | ICD-10-CM

## 2023-02-23 DIAGNOSIS — S7291XA Unspecified fracture of right femur, initial encounter for closed fracture: Secondary | ICD-10-CM

## 2023-02-23 DIAGNOSIS — E7800 Pure hypercholesterolemia, unspecified: Secondary | ICD-10-CM

## 2023-02-23 DIAGNOSIS — Z1379 Encounter for other screening for genetic and chromosomal anomalies: Secondary | ICD-10-CM

## 2023-02-23 DIAGNOSIS — C50911 Malignant neoplasm of unspecified site of right female breast: Secondary | ICD-10-CM

## 2023-02-23 DIAGNOSIS — M549 Dorsalgia, unspecified: Secondary | ICD-10-CM

## 2023-02-23 DIAGNOSIS — N816 Rectocele: Secondary | ICD-10-CM

## 2023-02-23 DIAGNOSIS — Z853 Personal history of malignant neoplasm of breast: Secondary | ICD-10-CM

## 2023-02-23 DIAGNOSIS — C44319 Basal cell carcinoma of skin of other parts of face: Secondary | ICD-10-CM

## 2023-02-23 DIAGNOSIS — I10 Essential (primary) hypertension: Secondary | ICD-10-CM

## 2023-02-23 DIAGNOSIS — C4441 Basal cell carcinoma of skin of scalp and neck: Secondary | ICD-10-CM

## 2023-02-23 DIAGNOSIS — S62102A Fracture of unspecified carpal bone, left wrist, initial encounter for closed fracture: Secondary | ICD-10-CM

## 2023-02-23 DIAGNOSIS — D045 Carcinoma in situ of skin of trunk: Secondary | ICD-10-CM

## 2023-02-23 DIAGNOSIS — Z92241 Personal history of systemic steroid therapy: Secondary | ICD-10-CM

## 2023-02-23 DIAGNOSIS — D044 Carcinoma in situ of skin of scalp and neck: Secondary | ICD-10-CM

## 2023-02-23 DIAGNOSIS — Z923 Personal history of irradiation: Secondary | ICD-10-CM

## 2023-02-23 NOTE — Patient Instructions
Stephanie Bentley,     Thank you for coming to see [place] today.   Please call our office or send a message through MyChart if you have any questions or concerns.                                                                                                                                                           Devoria Glassing RN, BSN, OCN, CBCN, CN-BN  Clinical Nurse Coordinator for Ernie Hew, APRN-BC        [facility]   [phone] (nurse phone)  [phone] (URGENT NEED or After-Hours Line - Answered 24/7)  [phone] (fax)      [facility]   Clinic: Monday, Thursday, Friday   Admin: Tuesday  [address]  [place], [place] [address]  Scheduling: [phone]  Fax: [phone]   [facility]: Wednesday  [address]  [place], [place] [address]  Scheduling: [phone]                         [phone]   Fax: [phone] or [phone]

## 2023-02-23 NOTE — Progress Notes
I spoke with Stephanie Bentley, and her daughter, ( her full name and date of birth were verified) this morning in Breast Imaging in regards to the Radiologist recommending that she have a breast ultrasound-guided biopsy.   She was educated on how to prepare for the biopsy, and what to expect during and after the procedure.   She currently is not taking a daily aspirin or any anti-coagulants.  Her procedure has been scheduled for 03/10/2023.  I reviewed with her that she needed to be at Breast Imaging 1 hour prior to the appointment time.  She verbalized understanding in regards to all instructions and did not have any questions.  She was given an appointment card with Breast Imaging contact information.

## 2023-02-23 NOTE — Progress Notes
US guided biopsy is scheduled for 03/10/2023

## 2023-03-08 ENCOUNTER — Encounter: Admit: 2023-03-08 | Discharge: 2023-03-08 | Payer: MEDICARE

## 2023-03-09 ENCOUNTER — Encounter: Admit: 2023-03-09 | Discharge: 2023-03-09 | Payer: MEDICARE

## 2023-03-09 DIAGNOSIS — R928 Other abnormal and inconclusive findings on diagnostic imaging of breast: Secondary | ICD-10-CM

## 2023-03-09 DIAGNOSIS — Z853 Personal history of malignant neoplasm of breast: Secondary | ICD-10-CM

## 2023-03-10 ENCOUNTER — Encounter: Admit: 2023-03-10 | Discharge: 2023-03-10 | Payer: MEDICARE

## 2023-03-10 DIAGNOSIS — D0439 Carcinoma in situ of skin of other parts of face: Secondary | ICD-10-CM

## 2023-03-10 DIAGNOSIS — Z853 Personal history of malignant neoplasm of breast: Secondary | ICD-10-CM

## 2023-03-10 DIAGNOSIS — C44319 Basal cell carcinoma of skin of other parts of face: Secondary | ICD-10-CM

## 2023-03-10 DIAGNOSIS — E7800 Pure hypercholesterolemia, unspecified: Secondary | ICD-10-CM

## 2023-03-10 DIAGNOSIS — M549 Dorsalgia, unspecified: Secondary | ICD-10-CM

## 2023-03-10 DIAGNOSIS — S62102A Fracture of unspecified carpal bone, left wrist, initial encounter for closed fracture: Secondary | ICD-10-CM

## 2023-03-10 DIAGNOSIS — D044 Carcinoma in situ of skin of scalp and neck: Secondary | ICD-10-CM

## 2023-03-10 DIAGNOSIS — C4441 Basal cell carcinoma of skin of scalp and neck: Secondary | ICD-10-CM

## 2023-03-10 DIAGNOSIS — Z923 Personal history of irradiation: Secondary | ICD-10-CM

## 2023-03-10 DIAGNOSIS — N816 Rectocele: Secondary | ICD-10-CM

## 2023-03-10 DIAGNOSIS — I10 Essential (primary) hypertension: Secondary | ICD-10-CM

## 2023-03-10 DIAGNOSIS — S7291XA Unspecified fracture of right femur, initial encounter for closed fracture: Secondary | ICD-10-CM

## 2023-03-10 DIAGNOSIS — C50911 Malignant neoplasm of unspecified site of right female breast: Secondary | ICD-10-CM

## 2023-03-10 DIAGNOSIS — D045 Carcinoma in situ of skin of trunk: Secondary | ICD-10-CM

## 2023-03-10 DIAGNOSIS — Z92241 Personal history of systemic steroid therapy: Secondary | ICD-10-CM

## 2023-03-10 DIAGNOSIS — Z1379 Encounter for other screening for genetic and chromosomal anomalies: Secondary | ICD-10-CM

## 2023-03-10 DIAGNOSIS — I499 Cardiac arrhythmia, unspecified: Secondary | ICD-10-CM

## 2023-03-10 DIAGNOSIS — R928 Other abnormal and inconclusive findings on diagnostic imaging of breast: Secondary | ICD-10-CM

## 2023-03-10 MED ORDER — LIDOCAINE 1%-EPINEPHRINE 1:100000 (BUFFERED) VIAL (BREAST CENTER)
10 mL | Freq: Once | INTRAMUSCULAR | 0 refills | Status: CP
Start: 2023-03-10 — End: ?

## 2023-03-10 MED ORDER — LIDOCAINE 1% (BUFFERED) SYRINGE (BREAST CENTER)
5 mL | Freq: Once | INTRAMUSCULAR | 0 refills | Status: CP
Start: 2023-03-10 — End: ?

## 2023-03-10 NOTE — Progress Notes
Scan: Ultrasound Guided Breast Biopsy Procedure    Order verified: Yes    Allergies reviewed: Pt denies any contraindications including allergies/sensitivity to metal or nickel: Yes    Patient medical history and home medication reviewed: Yes.     Pre-Procedure: Patient educated on breast biopsy, procedure, possible complications and follow-up care. All questions addressed and consent signed with provider. Skin marked per protocol.    Will await pathology results callback from provider. Will call with any questions or concerns given post-procedure care instructions and callback number.    [facility] Nurse [phone]

## 2023-03-20 ENCOUNTER — Encounter: Admit: 2023-03-20 | Discharge: 2023-03-20 | Payer: MEDICARE

## 2023-03-24 ENCOUNTER — Encounter: Admit: 2023-03-24 | Discharge: 2023-03-24 | Payer: MEDICARE

## 2023-03-24 NOTE — Progress Notes
Navigation Follow Up Assessment Document    Patient Name:  Al Gagen Gad  DOB:  03-May-1932    Direct referral: None    Appointment Info:   Future Appointments   Date Time Provider Department Center   03/30/2023 11:45 AM Vear Clock, Levy Sjogren, MD [id number] [place] Exam   04/10/2023  2:00 PM O'Dea, Daneil Dan, MD [id number] [place] Exam   02/15/2024  9:15 AM SCREENING TOMO ROOM [facility] Radiology   02/15/2024 10:30 AM Ranallo, Antonieta Iba, APRN-NP CCC2 [place] Exam        Diagnosis & Reason for Visit:  AMYLIA COLLAZOS, 89 y.o. is an established patient of Ernie Hew APP for her history of 2015 right breast invasive lobular carcinoma. History per provider note: Patient had right breast lumpectomy with Dr Haze Rushing 08/29/2014. Completed adjuvant right breast radiation 09/25/2014-10/20/2014 Corey Harold, MD in [place]). Dose: 42.56 Gy in 16 daily fractions to the right breast. Adjuvant endocrine therapy: Arimidex, initiated 10/2014 - 09/2020 with Dr Warren Danes.    Currently on patient's 02/23/23 screening mammogram at [facility]  left breast asymmetry  was detected. Same day workup of diagnostic mammogram with ultrasound noted left breast mass. 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     Final Diagnosis:   A. Breast, left breast 1:00, 6cm FTN, ultrasound guided biopsy:    Ductal carcinoma in situ, intermediate grade, cribriform type. See   comment.   Focal calcifications associated with blood vessels present   Testing performed on block number: A2   Estrogen Receptor (ER):          100%     Positive     Stain Intensity:   Strong   Progesterone Receptor (PR):     87%     Positive     Stain Intensity:   Strong     Location of Films:  IN HOUSE    Location of Pathology:  IN HOUSE    Genetic testing:   09/30/2021 My[facility] myRisk Hereditary Cancer Test:  Negative, no mutation or VUS.

## 2023-03-27 ENCOUNTER — Encounter: Admit: 2023-03-27 | Discharge: 2023-03-27 | Payer: MEDICARE

## 2023-03-29 ENCOUNTER — Encounter: Admit: 2023-03-29 | Discharge: 2023-03-29 | Payer: MEDICARE

## 2023-03-29 NOTE — Progress Notes
LYMPHEDEMA DATASHEET-BASELINE    DATE:  03/29/2023    PATIENT NAME:   Stephanie Bentley  DATE OF BIRTH:   17-Nov-1931  MRN:   [id number]    BASELINE MEASUREMENTS:    BIS= -3.0, Left  (Not a true baseline; hx of SLNB (0/4) in 2015)    Handedness:  right handed      Contributing comorbidities:  history of right breast cancer in 2015, BCC, SCC, HTN, HLD,     Circumferential Measurements   RUE/LUE cm   Hand:  20.6 /  19.7    Wrist:  17.5 /  16.8     8 cm:  17.2 /  18.0   16 cm:  24.0 /  23.2   Elbow cm:  25.8 /  25.9   8 cm:  26.6 /  26.8   16 cm:  28.5 /  31.3   24 cm:  34.8 /  34.8     Saw patient in coordination with their breast surgery appointment for baseline lymphedema evaluation. Reviewed goal of visit. Explained goal of baseline evaluation is to aid with future lymphedema monitoring. Baseline assessment and measurements completed, including bilateral arm circumference utilizing a Medi tape measure, L-Dex Bioimpedence test, ROM, strength and functional status. All assessments WNL.      Plan:  Lymphedema education postoperatively.

## 2023-03-30 ENCOUNTER — Encounter: Admit: 2023-03-30 | Discharge: 2023-03-30 | Payer: MEDICARE

## 2023-03-30 ENCOUNTER — Ambulatory Visit: Admit: 2023-03-30 | Discharge: 2023-03-30 | Payer: MEDICARE

## 2023-03-30 DIAGNOSIS — Z01818 Encounter for other preprocedural examination: Secondary | ICD-10-CM

## 2023-03-30 DIAGNOSIS — Z923 Personal history of irradiation: Secondary | ICD-10-CM

## 2023-03-30 DIAGNOSIS — C44319 Basal cell carcinoma of skin of other parts of face: Secondary | ICD-10-CM

## 2023-03-30 DIAGNOSIS — I499 Cardiac arrhythmia, unspecified: Secondary | ICD-10-CM

## 2023-03-30 DIAGNOSIS — N816 Rectocele: Secondary | ICD-10-CM

## 2023-03-30 DIAGNOSIS — I10 Essential (primary) hypertension: Secondary | ICD-10-CM

## 2023-03-30 DIAGNOSIS — S7291XA Unspecified fracture of right femur, initial encounter for closed fracture: Secondary | ICD-10-CM

## 2023-03-30 DIAGNOSIS — C4441 Basal cell carcinoma of skin of scalp and neck: Secondary | ICD-10-CM

## 2023-03-30 DIAGNOSIS — R32 Unspecified urinary incontinence: Secondary | ICD-10-CM

## 2023-03-30 DIAGNOSIS — C50911 Malignant neoplasm of unspecified site of right female breast: Secondary | ICD-10-CM

## 2023-03-30 DIAGNOSIS — Z1379 Encounter for other screening for genetic and chromosomal anomalies: Secondary | ICD-10-CM

## 2023-03-30 DIAGNOSIS — M549 Dorsalgia, unspecified: Secondary | ICD-10-CM

## 2023-03-30 DIAGNOSIS — E7800 Pure hypercholesterolemia, unspecified: Secondary | ICD-10-CM

## 2023-03-30 DIAGNOSIS — M1990 Unspecified osteoarthritis, unspecified site: Secondary | ICD-10-CM

## 2023-03-30 DIAGNOSIS — S62102A Fracture of unspecified carpal bone, left wrist, initial encounter for closed fracture: Secondary | ICD-10-CM

## 2023-03-30 DIAGNOSIS — D0512 Intraductal carcinoma in situ of left breast: Secondary | ICD-10-CM

## 2023-03-30 DIAGNOSIS — Z92241 Personal history of systemic steroid therapy: Secondary | ICD-10-CM

## 2023-03-30 DIAGNOSIS — Z9189 Other specified personal risk factors, not elsewhere classified: Secondary | ICD-10-CM

## 2023-03-30 DIAGNOSIS — D045 Carcinoma in situ of skin of trunk: Secondary | ICD-10-CM

## 2023-03-30 DIAGNOSIS — C4490 Unspecified malignant neoplasm of skin, unspecified: Secondary | ICD-10-CM

## 2023-03-30 DIAGNOSIS — D0439 Carcinoma in situ of skin of other parts of face: Secondary | ICD-10-CM

## 2023-03-30 DIAGNOSIS — D044 Carcinoma in situ of skin of scalp and neck: Secondary | ICD-10-CM

## 2023-03-30 DIAGNOSIS — H9190 Unspecified hearing loss, unspecified ear: Secondary | ICD-10-CM

## 2023-03-30 LAB — CBC AND DIFF
ABSOLUTE BASO COUNT: 0 K/UL (ref 0–0.20)
ABSOLUTE EOS COUNT: 0.1 K/UL (ref 0–0.45)
ABSOLUTE LYMPH COUNT: 2.4 K/UL (ref 1.0–4.8)
ABSOLUTE MONO COUNT: 0.5 K/UL (ref 0–0.80)
ABSOLUTE NEUTROPHIL: 5.1 K/UL (ref 1.8–7.0)
BASOPHILS %: 1 % (ref 0–2)
EOSINOPHILS %: 1 % — ABNORMAL LOW (ref >60)
HEMATOCRIT: 44 % (ref 36–45)
HEMOGLOBIN: 14 g/dL — ABNORMAL HIGH (ref 12.0–15.0)
LYMPHOCYTES %: 29 % (ref 24–44)
MCH: 33 pg (ref 26–34)
MCHC: 33 g/dL (ref 32.0–36.0)
MCV: 100 FL — ABNORMAL HIGH (ref 80–100)
MONOCYTES %: 7 % (ref 4–12)
MPV: 9.1 FL (ref 7–11)
NEUTROPHILS %: 62 % (ref 41–77)
PLATELET COUNT: 204 K/UL (ref 150–400)
RBC COUNT: 4.4 M/UL (ref 4.0–5.0)
RDW: 12 % (ref 11–15)
WBC COUNT: 8.3 K/UL (ref 4.5–11.0)

## 2023-03-30 LAB — COMPREHENSIVE METABOLIC PANEL
POTASSIUM: 4.7 MMOL/L (ref 3.5–5.1)
SODIUM: 141 MMOL/L (ref 137–147)

## 2023-03-30 MED ORDER — CEFAZOLIN 2 GRAM IV SOLR
2 g | Freq: Once | INTRAVENOUS | 0 refills
Start: 2023-03-30 — End: ?

## 2023-03-30 NOTE — Patient Instructions
BREAST SURGERY POST-OPERATIVE INSTRUCTIONS    DIET  -You have no dietary restrictions. Please continue with a healthy balanced diet.  -Do not drink alcohol while taking narcotic pain medication.    ACTIVITY  -No driving while taking narcotic pain medication.  -No lifting greater than 10 pounds.   -No repetitive motions with the arm on the side of surgery (ie. laundry, vacuuming, washing windows/floors, etc.)  -Begin the exercises found in your treatment guide 24 hours after surgery.  -You may resume your normal activity once cleared by your surgeon. This can be discussed at your post-operative appointment.    INCISION CARE  -Keep your incision clean and dry.    -You may shower 24 hours following your procedure. It is safe for the incisions to get wet unless otherwise instructed by your surgeon. Pat incisions dry after showering.  -Avoid applying deodorants, powders, creams, lotions, etc. to your incision for 4 weeks.  -Usually, there are no stitches to be removed. You will either have a Dermabond (skin glue) or Therabond (silver covering) dressing over your incision.    *Dermabond is an antimicrobial barrier that will begin to flake after approximately 1 week. Do not peel to remove prior to flaking. Do NOT apply ointments to your incisions over the Dermabond, as these substances can weaken the barrier and allow for skin edge separation.    *Therabond is a silver impregnated dressing that promotes wound healing. This dressing is to stay in place until your surgeon instructs you to remove it.  -Your incision should gradually look better each day.  If you notice unusual swelling, redness, drainage, have increasing pain at the site, or have a fever greater than 100 degrees, notify your physician immediately.  -You may have a pink post-surgery bra and fluffs in place.  This can be removed 24 hours after surgery to shower. Continue to wear the pink bra or a snug sports bra until your post-operative appointment. Do NOT wear underwire bras.    SIGNS & SYMPTOMS  -Please contact your doctor if you have any of the following symptoms: temperature higher than 100 degrees F, uncontrolled pain, persistent nausea and/or vomiting, difficulty breathing or breast redness, leakage or incision breakdown/opening.     IF YOU HAVE A DRAIN  *WASH HANDS PRIOR TO ANY HANDLING OF DRAIN.   -Please write down daily (total for 24 hours) drain output and empty multiple times a day if needed or according to physician's instructions. Record the output in milliliters.  -There is a CHG drain dressing in place over the entry site of your drain. This is to remain in place until removed by your surgeon. There is an antimicrobial gel in the center rectangle of the dressing. This is normal and not a sign of fluid leaking. If there is physical liquid leaking on the skin outside the area of the drain, please notify your surgeons office.   -To empty the drain, open the lid on top of the bulb.  Pour the fluid into a measuring cup and record.  Close by squeezing the bulb until as much air as possible is removed, then cap the lid to recreate suction.   -Strip the drain twice daily or as needed by pinching the tubing near the skin with both hands.  Keep hand closest to you steady and with the other, slowly squeeze the liquid toward the bulb.  This will prevent any clots or debris from clogging the drain.   -It is okay to shower with the drain   and dressing in place.  Pat the dressing dry after showering.    *When showering consider placing your back to the water and letting the water run over the surgical areas. Soap and water contact over the surgical sites is OK. Please do not soak or scrub the sites.     *A lanyard can be used to hook the drains around your neck to allow freedom of hands for showering.    -Please bring the drain record to your next clinic appointment.   -Secure drains to your post-operative bra or use a safety pin to pin to clothing. Do not let drains dangle without support.   -DO NOT TRY TO PUSH THE DRAIN BACK IN IF IT GETS PULLED OUT (even a little bit), AS THIS IS A BIG INFECTION RISK.  If you have any problems with the drain (ex. large amounts of fluid, very bloody fluid, or loss of suction), please call your doctor.    MEDICATIONS  -Do not exceed 4000mg of Tylenol (acetaminophen) in a 24-hour period. The pain medication prescribed may have a narcotic and Tylenol together. Please verify this on your medication bottle prior to taking additional Tylenol.    OPIOID (NARCOTIC) PAIN MEDICATION SAFETY  -We care about your comfort and believe you may need opioid medications at this time to treat your pain.  An opioid is a strong pain medication.  It is only available by prescription for moderate to severe pain.  Usually, these medications are used for only a short time to treat pain.    -When used the right way, opioids are safe and effective medications to treat your pain.  Yet, when used in the wrong way, opioids can be dangerous for you or others.  Opioids do not work for everyone.  Most patients do not get full relief of their pain from opioid medication; full relief of your pain may not be possible.    -For your safety, we ask you to follow these instructions:  *Only take your opioid medication as prescribed.  If your pain is not controlled with the prescribed dose or the medication is not lasting long enough, call your doctor.  *Do not break or crush your opioid medication unless your doctor or pharmacist says you can.  With certain medications this can be dangerous and may cause death.  *Never share your medication with others even if they appear to have a good reason.  Never take someone else?s pain medication - this is dangerous and illegal.  Overdoses and deaths have occurred.  *Keep your opioid medications safe, as you would with cash, in a lock box or similar container.  *Make sure your opioids are going to be secure, especially if you are around children or teens.   *Talk with your doctor or pharmacist if you have questions about taking other medications.   *Avoid driving, operative machinery or drinking alcohol while taking opioid pain medication.  This may be unsafe.     CONSTIPATION PREVENTION  -Most patients require narcotic pain medication following surgery and these medications frequently cause significant constipation. Constipation can also occur post-operatively due to anesthesia, inactivity and decrease in oral intake. Constipation can cause discomfort, bloating, nausea and vomiting. Below are recommendations on how to treat and prevent constipation if you experience this following surgery.    -Start a daily laxative regimen as soon as you start any narcotic pain medication or see any deviation from your normal bowel pattern. You will be given a prescription for Senna-S after   surgery. If you stay overnight after your surgery, a laxative will be started while you are in the hospital.      -Medications that can help with constipation:    *Senna-S    *Miralax, 1-2 capfuls daily    *Fiber supplements (Fibercon or Benefiber). Follow manufacturer dosing recommendations.                                          You can also consider dietary fiber options such as prunes or prune juice.    *Probiotics (Bio-K+, Culturelle, Align or Florastor), 1-2 daily. Do not take a probiotic if you are on chemotherapy unless you have received permission from your oncologist.     *Milk of Magnesia 1,2000mg/5mL, 2-4 doses daily. Do not take if you have kidney issues or are on dialysis.     -If you are still having trouble with constipation despite using the above medications for 1-2 days without a bowel movement or have significant discomfort from constipation you may try the below over the counter option. Do not go longer than 2 days without a bowel movement before you try taking additional laxatives.    *Magnesium Citrate (1-2 bottles in 24 hour period). Do not take if you have kidney issues or are on dialysis.       For questions or concerns regarding your hospital stay:  - DURING BUSINESS HOURS (8:00 AM - 4:30 PM):    Call [phone] to speak to your surgeon?s nurse    - AFTER BUSINESS HOURS (4:30 PM - 8:00 AM, on weekends, or holidays):  Call [phone] and ask to page the BREAST SURGERY team      IF A PLASTIC SURGEON WAS INVOLVED IN YOUR SURGERY:    - DURING BUSINESS HOURS (8:00 AM - 4:30 PM):    Call the office at [phone]    - AFTER BUSINESS HOURS (4:30 PM - 8:00 AM, on weekends, or holidays):  Call [phone] and ask to page the PLASTIC SURGERY team    *If you had reconstruction with a plastic surgeon, all pain medication refills will be completed by their office.

## 2023-03-30 NOTE — Progress Notes
Name: Stephanie Bentley          MRN: [id number]      DOB: 1932/06/19      AGE: 89 y.o.   DATE OF SERVICE: 03/30/2023       Reason for Visit:  Heme/Onc Care      Stephanie Bentley is a 89 y.o. female.      Cancer Staging   Ductal carcinoma in situ (DCIS) of left breast  Staging form: Breast, AJCC 8th Edition  - Clinical stage from 03/10/2023: Stage 0 (cTis (DCIS), cN0, cM0, ER+, PR+, HER2: Not Assessed) - Signed by Stephanie Cunas, PA-C on 03/27/2023    Malignant neoplasm of right breast [facility])  Staging form: Breast, AJCC 7th Edition  - Clinical: Stage IIA (T2, N0, cM0) - Signed by Stephanie Alexandria, PA-C on 08/19/2014  - Pathologic: Stage IIA (T2, N0, cM0) - Signed by Stephanie Combes, APRN-NP on 12/16/2014    DIAGNOSIS:    1. 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 Myriad myRisk negative, dx 09/2021    History of Present Illness    Stephanie Bentley is a female who presented to [facility] on 03/30/2023 at age 6 with two of her daughters and her grand daughter for left breast cancer. Stephanie Bentley has a history of right breast cancer in 2015. Initial biopsy 07/25/2014 revealed grade 2 IDC (ER positive).  She underwent a right breast sono-guided lumpectomy/right axillary sentinel lymph node biopsy on 08/29/14 with Stephanie Bentley. Pathology demonstrated a 3.5 cm grade 2 ILC (ER100%, PR0%, HER2 0, Ki-67 68%), margins negative, no LVI, 0/4 lymph nodes. She completed Radiation Therapy with Stephanie Bentley in [place] at [facility]. Stephanie Bentley in January 2016. She took Arimidex per Stephanie Bentley 10/2014 - 09/2020. She has been following with Stephanie Hew, APRN-NP in [place]. Bilateral screening mammogram 02/23/2023 ([place]) revealed a left breast asymmetry. Same-day left diagnostic mammogram and ultrasound revealed a 0.9 cm left breast mass at 1:00, 6 cm FTN with normal-appearing axillary lymph nodes. Ultrasound-guided left breast biopsy on 03/10/2023 revealed hormone-positive grade 2 DCIS. She denies any palpable breast masses, skin changes, nipple changes, or nipple discharge. She reports a history of an irregular heart beat, but denies history of atrial fibrillation, SVT, or other cardiac issues. She takes metoprolol for it and for her blood pressure. She denies any history of MI, DVT/PE, stroke. She is a former smoker, quit in 1993. She admits to some mild shortness of breath with significant exertion, but states it seems to be an expected amount at her age. She denies worsening shortness of breath or limitations in her activity. She uses a wheeled walker most of the time to ambulate, as she broke her femur in August 2022.She lives alone and performs all daily activities without difficulty.    BREAST IMAGING:  Mammogram:    --Bilateral screening mammogram 02/23/2023 (Stephan[facility] Bentley) revealed The breasts have scattered areas of fibroglandular density. Posttreatment findings within the right breast.  No suspicious right breast abnormality. There is a focal asymmetry within the upper outer left breast at middle depth on MLO image 20, CC image 18 and lateral image 27.    --Left diagnostic mammogram 02/23/2023 (Stephanie Bentley) revealed Spot compression views of the left breast demonstrate partial effacement of the focal asymmetry in the upper outer quadrant 1:00, middle depth.  Further evaluation with diagnostic ultrasound will  be performed.     Ultrasound:    --Left targeted ultrasound 02/23/2023 ([facility]) revealed Within the left breast at the 1 o'clock position 6 cm from the nipple, there is a irregular hypoechoic mass with microlobulated margins.  This mass measures 0.9 x 0.6 x 0.8 cm.  This corresponds to the mass on mammogram 5 morphologically normal left axillary lymph nodes were identified. IMPRESSION: 1. Screening detected 0.9 cm left breast mass at 1:00, 6 cm from the nipple. Recommend ultrasound-guided biopsy for tissue diagnosis. 2.  No abnormal left axillary lymph nodes identified.     REPRODUCTIVE HEALTH:  Age at first Menarche: 36   Age at First Live Birth: 72   Age at Menopause:  77, no HRT   Gravida:  5  Para: 5  Breastfeeding:      PROCEDURE: 1.  right breast sono-guided lumpectomy/right axillary sentinel lymph node biopsy on 08/29/14 (Stephanie Bentley)  2. 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 04/17/2018    Basal cell carcinoma (BCC) of right side of neck 06/02/2017    Basal cell carcinoma, forehead 08/26/2015    x2; forehead and anterior hairline    Cancer of skin     Femur fracture, right (HCC) 05/22/2021    Genetic testing of female 09/30/2021    (Myriad myRisk - [place]); Negative    Hearing reduced     Hypercholesterolemia 06/16/2016    Hypertension 06/16/2016    Incontinence     Invasive ductal carcinoma of right breast (HCC) 07/2014    Irregular heart rate     Left wrist fracture 08/18/2018    [place] for [employer]    Personal history of irradiation     Rectocele 2014    S/P epidural steroid injection 06/30/2017    S/P epidural steroid injection 08/08/2017    S/P epidural steroid injection 08/28/2017    S/P epidural steroid injection 09/22/2017    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    pre-auricular    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    right neck     Surgical History:   Procedure Laterality Date    HEMORRHOIDECTOMY  1965    LUMBAR LAMINECTOMY  2005    L4-5    STIMULATOR IMPLANT  07/2012    BLADDER    COLONOSCOPY N/A 2014    BREAST BIOPSY Right 07/24/2014    NCBx    BREAST LUMPECTOMY Right 08/29/2014    SLNB 0/4+ ([place])    SKIN CANCER EXCISION  10/26/2015  Basal cell carcinoma - forehead/anterior hairline    SKIN CANCER EXCISION Right 05/03/2016    Squamous cell carcinoma in situ - right neck    SKIN CANCER EXCISION Right 06/07/2016    right neck - no residual    SKIN CANCER EXCISION Bilateral 04/17/2018    SCCIS left pre-auricular; BCC: left cheek & right forehead    FEMUR FRACTURE SURGERY Right 05/22/2021    [facility])    BREAST BIOPSY Left     03/10/23 CNB ([place]) L 1:00 6 CM FTN    TONSIL AND ADENOIDECTOMY Bilateral     as child     Family History   Problem Relation Name Age of Onset    Hypertension Mother Corky Crafts Chain     Heart Disease Father Shawnie Dapper     None Reported Son Neurosurgeon [place]     None Reported Daughter Darla English as a second language teacher     None Reported Maternal Grandmother      None Reported Maternal Grandfather      None Reported Paternal Grandmother      None Reported Paternal Grandfather      None Reported Sister LouAnne     None Reported Sister Leta     None Reported Son Educational psychologist     None Reported Daughter Steward Drone     None Reported Daughter Lawson Fiscal     Cancer Neg Hx       Social History     Socioeconomic History    Marital status: Widowed   Tobacco Use    Smoking status: Former     Current packs/day: 0.00     Average packs/day: 1 pack/day for 40.0 years (40.0 ttl pk-yrs)     Types: Cigarettes     Start date: 09/17/1951     Quit date: 09/17/1991     Years since quitting: 31.5    Smokeless tobacco: Never   Substance and Sexual Activity    Alcohol use: Yes     Comment: social, less than weekly    Drug use: No    Sexual activity: Not Currently     Partners: Male Objective:          acetaminophen (TYLENOL) 500 mg tablet Take two tablets by mouth as Needed for Pain.    ascorbic acid (VITAMIN C PO) Take  by mouth twice daily.    baclofen 5 mg tablet Take one tablet by mouth three times daily.    CALCIUM PO Take 1,200 mg by mouth.    cephalexin (KEFLEX) 250 mg capsule Take one capsule by mouth four times daily.    Cholecalciferol (Vitamin D3) 25 mcg (1,000 unit) cap Take three capsules by mouth twice daily.    folic acid/multivit-min/lutein (CENTRUM SILVER PO) Take  by mouth.    lactobacillus combination no.4 (SENIOR PROBIOTIC PO) Take  by mouth.    losartan (COZAAR) 50 mg tablet Take one tablet by mouth daily.    Lutein 20 mg cap Take one capsule by mouth.    magnesium oxide (MAGOX) 400 mg (241.3 mg magnesium) tablet Take 250 mg by mouth daily.    metoprolol (LOPRESSOR) 25 mg tablet Take one-half tablet by mouth daily.    NAPROXEN SODIUM (ALEVE PO) Take  by mouth as Needed.    polyethylene glycol 3350 (MIRALAX) 17 g packet Take one packet by mouth daily.    tolterodine LA (DETROL LA) 4 mg capsule Take one capsule by mouth daily.     Vitals:    03/30/23 1136   BP: Marland Kitchen)  140/73   BP Source: Arm, Left Upper   Pulse: 88   Temp: 36.7 ?C (98 ?F)   Resp: 16   SpO2: 93%   TempSrc: Temporal   PainSc: Zero   Weight: 79.5 kg (175 lb 3.2 oz)     Body mass index is 25.87 kg/m?Marland Kitchen     Pain Score: Zero       Fatigue Scale: 0-None    Pain Addressed:  N/A    Patient Evaluated for a Clinical Trial: No treatment clinical trial available for this patient.     [place] Cooperative Oncology Group performance status is 1, Restricted in physically strenuous activity but ambulatory and able to carry out work of a light or sedentary nature, e.g., light house work, office work.     Physical Exam  Vitals reviewed.       RIGHT BREAST EXAM:  Breast:  No palpable masses. Well healed breast and axillary incisions with mild skin retraction at site of previous lumpectomy cavity.  Skin Erythema: No  Attachment of Overlying Skin:  No  Peau d' orange:  No  Chest Wall Attachment:  No  Nipple Inversion:  No  Nipple Discharge: No    LEFT BREAST EXAM:  Breast: No palpable masses. Post biopsy site changes.  Skin Erythema:  No  Attachment of Overlying Skin:  No  Peau d' orange:  No  Chest Wall Attachment: No  Nipple Inversion:  No  Nipple Discharge:  No    RIGHT NODAL BASIN EXAM:  Axillary:  negative  Infraclavicular:  negative  Supraclavicular:  negative    LEFT NODAL BASIN EXAM:  Axillary:  negative  Infraclavicular: negative  Supraclavicular:  negative      Constitutional: Alert, cooperative, NAD  Eyes: non-icteric, no discharge, pupils are equal and round  ENT: NCAT, moist mucous membranes  Cardiovascular: normal rate, regular rhythm, normal heart sounds, no murmur or gallop  Respiratory: non-labored respirations on RA, lungs clear with normal breath sounds, normal effort, no respiratory distress, no wheezes or rales  Skin: warm, dry, no rashes or erythema, no cyanosis  Musculoskeletal: normal range of motion, no edema  Neuro: alert and orientated, moves all extremities, no cranial nerve deficit  Psych: behavior is normal, judgment and thought content normal  Lymphatic: no evidence of upper extremity lymphedema                Assessment and Plan:  DIAGNOSIS:    1. 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 Myriad myRisk negative, dx 09/2021    Patient was discussed in a multidisciplinary fashion with Stephanie Bentley to coordinate care and she is in agreement with the below plan.    The diagnosis, including type of breast cancer, tumor markers, grade, and stage were discussed today. We discussed the natural history of breast cancer from in situ to invasive disease.  We discussed the need for a multidisciplinary approach to breast cancer treatment with local and systemic therapy.  She has been following with Stephanie Bentley for her previous right breast cancer so will meet with her to discuss systemic therapy in more detail which will be based on final surgical pathology. We reviewed her imaging results including the additional targeted imaging.     We discussed local treatment options of the breast in detail including lumpectomy with adjuvant radiation as well as mastectomy.  Similar risk of local/regional recurrence  with BCT and mastectomy, with no survival benefit for mastectomy. We discussed the possible need for adjuvant radiation following lumpectomy and will place this referral post operatively. We discussed radioactive seed localized lumpectomy with need for seed placement prior to the procedure. We discussed the lumpectomy procedure in detail including the use of local tissue rearrangement to improve cosmesis and reduce seroma formation. We discussed the possible positive margins found with lumpectomy and need for margin re-excision. We also discussed potential complications including but not limited to bleeding, infection, seroma, positive margins needing additional operation, and cosmetic asymmetry. She states she understands the benefits and risks of the procedure and is interested in proceeding with radioactive seed localized lumpectomy at this time.    We discussed that she does not currently need SLNB to evaluate for nodal metastasis as she is only known to have DCIS and will be undergoing a lumpectomy.  If invasive cancer was found on final pathology then we would address if there is a need for return to the operating room for a sentinel lymph node biopsy. We discussed the risk of lymphedema, paraesthesias, range of motion issues, etc associated with nodal surgery. A baseline measurement with the BIS will be obtained and she will follow in our lymphedema clinic.     Ms. Larr and her family were given ample time to ask questions all which were answered to her satisfaction.  She expressed her understanding and are ready proceed with the plan as outlined above.  We will obtain preoperative labs prior to the operation. She was provided contact information was encouraged to call with any follow-up questions or concerns.     Left radioactive seed localized lumpectomy  RTC post op  Continue to follow with Stephanie Bentley  Radiation oncology consult post op      Stephanie Cunas, PA-C      ATTESTATION:     I personally reviewed images and pathology, performed the review of systems, physical exam, history, discussion, assessment, and treatment plan. The case was discussed with Traci Sermon, PA-C and I have altered the documentation as needed to reflect my assessment and plan.    Renford Dills, MD

## 2023-03-30 NOTE — Progress Notes
Bioimpedance Spectroscopy performed.  Advised patient that additional information will be sent via Mychart (preferred) or phone if indicated by the lymphedema nurse within 24 hours.

## 2023-03-30 NOTE — Progress Notes
Spoke with patient regarding Left Radioactive Seed Localized Lumpectomy and confirmed surgery date of 04/18/23 with Dr. Vear Clock. Consent was obtained at this time. The patient was informed that they would receive a call from the surgery department the day prior to surgery to confirm time of arrival.  The patient was given detailed instructions about where to check in the day of surgery.     Paperwork explaining pre-operative instructions was given to the patient and reviewed in detail. Informed patient that we will see them post operatively 2 weeks after date of surgery. Discussed whether this appointment would be appropriate as a telehealth appointment.     The patient has been educated of NPO requirements starting at midnight the night before surgery. Also informed that a driver will need to accompany patient due to the influence of anesthesia including narcotics post procedure. Informed patient that the Anesthesia department will call to discuss lab work, medication review (will discuss medications that need to be stopped 7-10 days prior to surgery), health history, anesthesia/ surgical history, and any other additional recommended testing for clearance for surgery.      The patient verbalized understanding of the information given and was provided a phone number to call with any questions or concerns.

## 2023-03-31 ENCOUNTER — Encounter: Admit: 2023-03-31 | Discharge: 2023-03-31 | Payer: MEDICARE

## 2023-03-31 DIAGNOSIS — Z1379 Encounter for other screening for genetic and chromosomal anomalies: Secondary | ICD-10-CM

## 2023-03-31 DIAGNOSIS — H9190 Unspecified hearing loss, unspecified ear: Secondary | ICD-10-CM

## 2023-03-31 DIAGNOSIS — S7291XA Unspecified fracture of right femur, initial encounter for closed fracture: Secondary | ICD-10-CM

## 2023-03-31 DIAGNOSIS — Z923 Personal history of irradiation: Secondary | ICD-10-CM

## 2023-03-31 DIAGNOSIS — D045 Carcinoma in situ of skin of trunk: Secondary | ICD-10-CM

## 2023-03-31 DIAGNOSIS — M1990 Unspecified osteoarthritis, unspecified site: Secondary | ICD-10-CM

## 2023-03-31 DIAGNOSIS — I10 Essential (primary) hypertension: Secondary | ICD-10-CM

## 2023-03-31 DIAGNOSIS — D0439 Carcinoma in situ of skin of other parts of face: Secondary | ICD-10-CM

## 2023-03-31 DIAGNOSIS — C50911 Malignant neoplasm of unspecified site of right female breast: Secondary | ICD-10-CM

## 2023-03-31 DIAGNOSIS — S62102A Fracture of unspecified carpal bone, left wrist, initial encounter for closed fracture: Secondary | ICD-10-CM

## 2023-03-31 DIAGNOSIS — Z92241 Personal history of systemic steroid therapy: Secondary | ICD-10-CM

## 2023-03-31 DIAGNOSIS — I499 Cardiac arrhythmia, unspecified: Secondary | ICD-10-CM

## 2023-03-31 DIAGNOSIS — C44319 Basal cell carcinoma of skin of other parts of face: Secondary | ICD-10-CM

## 2023-03-31 DIAGNOSIS — D0512 Intraductal carcinoma in situ of left breast: Secondary | ICD-10-CM

## 2023-03-31 DIAGNOSIS — R32 Unspecified urinary incontinence: Secondary | ICD-10-CM

## 2023-03-31 DIAGNOSIS — C4441 Basal cell carcinoma of skin of scalp and neck: Secondary | ICD-10-CM

## 2023-03-31 DIAGNOSIS — E7800 Pure hypercholesterolemia, unspecified: Secondary | ICD-10-CM

## 2023-03-31 DIAGNOSIS — D044 Carcinoma in situ of skin of scalp and neck: Secondary | ICD-10-CM

## 2023-03-31 DIAGNOSIS — N816 Rectocele: Secondary | ICD-10-CM

## 2023-03-31 DIAGNOSIS — M549 Dorsalgia, unspecified: Secondary | ICD-10-CM

## 2023-04-03 NOTE — Progress Notes
Left a message for pt to return my call re: appointment on 04/14/23 at [facility].

## 2023-04-04 ENCOUNTER — Encounter: Admit: 2023-04-04 | Discharge: 2023-04-04 | Payer: MEDICARE

## 2023-04-04 DIAGNOSIS — D0512 Intraductal carcinoma in situ of left breast: Secondary | ICD-10-CM

## 2023-04-04 NOTE — Progress Notes
I spoke with Stephanie Bentley this morning in regards to her scheduled Left Breast Radioactive Seed Localization procedure on 04/14/23 at 1300 in [facility].   She was educated on how to prepare for the Radioactive Seed Localization, and what to expect during and after the procedure.   She stated that her surgery date is 04/18/23.   I reviewed with her that she needed to be at [facility] 30 minutes prior to the appointment time.  She verbalized understanding in regards to all instructions and did not have any questions.

## 2023-04-10 ENCOUNTER — Encounter: Admit: 2023-04-10 | Discharge: 2023-04-10 | Payer: MEDICARE

## 2023-04-10 DIAGNOSIS — N816 Rectocele: Secondary | ICD-10-CM

## 2023-04-10 DIAGNOSIS — S7291XA Unspecified fracture of right femur, initial encounter for closed fracture: Secondary | ICD-10-CM

## 2023-04-10 DIAGNOSIS — C44319 Basal cell carcinoma of skin of other parts of face: Secondary | ICD-10-CM

## 2023-04-10 DIAGNOSIS — M549 Dorsalgia, unspecified: Secondary | ICD-10-CM

## 2023-04-10 DIAGNOSIS — R32 Unspecified urinary incontinence: Secondary | ICD-10-CM

## 2023-04-10 DIAGNOSIS — D045 Carcinoma in situ of skin of trunk: Secondary | ICD-10-CM

## 2023-04-10 DIAGNOSIS — I10 Essential (primary) hypertension: Secondary | ICD-10-CM

## 2023-04-10 DIAGNOSIS — E7800 Pure hypercholesterolemia, unspecified: Secondary | ICD-10-CM

## 2023-04-10 DIAGNOSIS — H9190 Unspecified hearing loss, unspecified ear: Secondary | ICD-10-CM

## 2023-04-10 DIAGNOSIS — I499 Cardiac arrhythmia, unspecified: Secondary | ICD-10-CM

## 2023-04-10 DIAGNOSIS — D0512 Intraductal carcinoma in situ of left breast: Secondary | ICD-10-CM

## 2023-04-10 DIAGNOSIS — D044 Carcinoma in situ of skin of scalp and neck: Secondary | ICD-10-CM

## 2023-04-10 DIAGNOSIS — D0439 Carcinoma in situ of skin of other parts of face: Secondary | ICD-10-CM

## 2023-04-10 DIAGNOSIS — Z923 Personal history of irradiation: Secondary | ICD-10-CM

## 2023-04-10 DIAGNOSIS — Z1379 Encounter for other screening for genetic and chromosomal anomalies: Secondary | ICD-10-CM

## 2023-04-10 DIAGNOSIS — S62102A Fracture of unspecified carpal bone, left wrist, initial encounter for closed fracture: Secondary | ICD-10-CM

## 2023-04-10 DIAGNOSIS — Z92241 Personal history of systemic steroid therapy: Secondary | ICD-10-CM

## 2023-04-10 DIAGNOSIS — C50911 Malignant neoplasm of unspecified site of right female breast: Secondary | ICD-10-CM

## 2023-04-10 DIAGNOSIS — M1990 Unspecified osteoarthritis, unspecified site: Secondary | ICD-10-CM

## 2023-04-10 DIAGNOSIS — C4441 Basal cell carcinoma of skin of scalp and neck: Secondary | ICD-10-CM

## 2023-04-10 NOTE — Progress Notes
Name: Stephanie Bentley          MRN: [id number]      DOB: 1932-02-20      AGE: 89 y.o.   DATE OF SERVICE: 04/10/2023           Reason for Visit:  No chief complaint on file.      Cancer Staging   Ductal carcinoma in situ (DCIS) of left breast  Staging form: Breast, AJCC 8th Edition  - Clinical stage from 03/10/2023: Stage 0 (cTis (DCIS), cN0, cM0, ER+, PR+, HER2: Not Assessed) - Signed by Renella Cunas, PA-C on 03/27/2023    Malignant neoplasm of right breast [facility])  Staging form: Breast, AJCC 7th Edition  - Clinical: Stage IIA (T2, N0, cM0) - Signed by Dimas Alexandria, PA-C on 08/19/2014  - Pathologic: Stage IIA (T2, N0, cM0) - Signed by Jacqualin Combes, APRN-NP on 12/16/2014      Stephanie Bentley is a 89 y.o. female.     Current Therapy: Pending pathologic staging     History of Present Illness:  Stephanie Bentley is a very pleasant postmenopausal female who presented initially in 2015 for right breast invasive ductal carcinoma. Stephanie Bentley noted a mass in her right breast in later September 2015. Bilateral diagnostic mammogram 07/10/14 [facility]) revealed a 3.5 cm bilobed mass in the right breast upper outer quadrant. Scattered calcifications were seen bilaterally. Right breast ultrasound 07/10/14 [facility]) revealed a 2.7 cm mass at 11:00, 3 cm FTN. Right breast sono-guided biopsy 07/25/14 [name]) revealed grade 2, estrogen positive, invasive ductal carcinoma.     Stephanie Bentley presented to the [facility] on 08/19[facility]14. She underwent a right breast sono-guided lumpectomy, right axillary sentinel lymph node biopsy by Dr. Arva Chafe on 08/29/14. Pathology demonstrated a 3.5 cm ILC, margins negative, nuclear grade 2, histologic grade II, no LVI, 0/4 lymph nodes (ER100%, PR0%, HER2 0, Ki-67 68%).     She completed left chest wall radiation with Dr. Lyndee Leo in [place], [place]. Subsequently started on Arimidex and completed five years of treatment (10/2014 to 09/2020). She has continued to follow in the breast cancer survivorship clinic with annual mammograms. On 02/23/2023, screening noted asymmetry and completed diagnostic views confirmed asymmetry in the upper outer quad, 1:00 position. Ultrasound showed irregular hypoechoic mass measuring 0.9 x 0.6 x 0.8 cm. Breast biopsy of the left lesion was performed 03/10/23, pathology DCIS, intermediate grade, cribriform type. ER 100, PR 87.        Review of Systems   Constitutional:  Negative for activity change, fatigue and fever.   HENT: Negative.     Eyes: Negative.    Respiratory:  Negative for chest tightness and shortness of breath.    Cardiovascular:  Negative for chest pain and leg swelling.   Gastrointestinal:  Positive for constipation (history rectocele (mild)). Negative for abdominal pain, blood in stool, diarrhea and rectal pain.   Endocrine: Negative.    Genitourinary: Negative.    Musculoskeletal:  Negative for gait problem (Ambulates w/ walker).   Skin:  Negative for rash.   Neurological:  Negative for weakness and light-headedness.   Psychiatric/Behavioral:  Negative for confusion. The patient is not nervous/anxious.          Past Medical History:   Diagnosis Date    Arthritis     Back pain     Basal cell carcinoma (BCC) of left cheek 04/17/2018    +margin    Basal cell carcinoma (BCC) of right forehead 06/02/2017    Basal  cell carcinoma (BCC) of right forehead 04/17/2018    Basal cell carcinoma (BCC) of right side of neck 06/02/2017    Basal cell carcinoma, forehead 08/26/2015    x2; forehead and anterior hairline    Femur fracture, right (HCC) 05/22/2021    Genetic testing of female 09/30/2021    ([facility] myRisk - [place]); Negative    Hearing reduced     Hypercholesterolemia 06/16/2016    Hypertension 06/16/2016    Incontinence     Invasive ductal carcinoma of right breast (HCC) 07/2014    Irregular heart rate     Left wrist fracture 08/18/2018    [place] the [employer] for [employer]    Personal history of irradiation     Rectocele 2014    S/P epidural steroid injection 06/30/2017    S/P epidural steroid injection 08/08/2017    S/P epidural steroid injection 08/28/2017    S/P epidural steroid injection 09/22/2017    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    pre-auricular    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    right neck       Surgical History:   Procedure Laterality Date    HEMORRHOIDECTOMY  1965    LUMBAR LAMINECTOMY  2005    L4-5    STIMULATOR IMPLANT  07/2012    BLADDER    COLONOSCOPY N/A 2014    BREAST BIOPSY Right 07/24/2014    NCBx    BREAST LUMPECTOMY Right 08/29/2014    SLNB 0/4+ ([place])    SKIN CANCER EXCISION  10/26/2015    Basal cell carcinoma - forehead/anterior hairline    SKIN CANCER EXCISION Right 05/03/2016    Squamous cell carcinoma in situ - right neck    SKIN CANCER EXCISION Right 06/07/2016    right neck - no residual    SKIN CANCER EXCISION Bilateral 04/17/2018    SCCIS left pre-auricular; BCC: left cheek & right forehead    FEMUR FRACTURE SURGERY Right 05/22/2021    ([facility] [place])    BREAST BIOPSY Left     03/10/23 CNB ([place]) L 1:00 6 CM FTN    TONSIL AND ADENOIDECTOMY Bilateral     as child        Family History   Problem Relation Name Age of Onset    Hypertension Mother Corky Crafts Chain     Heart Disease Father Shawnie Dapper     None Reported Son Neurosurgeon Misenheimer     None Reported Daughter Darla English as a second language teacher     None Reported Maternal Grandmother      None Reported Maternal Grandfather      None Reported Paternal Grandmother      None Reported Paternal Grandfather      None Reported Sister LouAnne     None Reported Sister Leta     None Reported Son Educational psychologist     None Reported Daughter Steward Drone     None Reported Daughter Lawson Fiscal     Cancer Neg Hx          Social History     Socioeconomic History    Marital status: Widowed   Tobacco Use    Smoking status: Former Current packs/day: 0.00     Average packs/day: 1 pack/day for 40.0 years (40.0 ttl pk-yrs)  Types: Cigarettes     Start date: 09/17/1951     Quit date: 09/17/1991     Years since quitting: 31.5    Smokeless tobacco: Never   Substance and Sexual Activity    Alcohol use: Yes     Comment: social, less than weekly    Drug use: No    Sexual activity: Not Currently     Partners: Male          OBJECTIVE:     Allergies   Allergen Reactions    Sulfa (Sulfonamide Antibiotics) NAUSEA ONLY            acetaminophen (TYLENOL) 500 mg tablet Take two tablets by mouth as Needed for Pain.    ascorbic acid (VITAMIN C PO) Take  by mouth twice daily.    baclofen 5 mg tablet Take one tablet by mouth three times daily as needed.    CALCIUM PO Take 1,200 mg by mouth.    cephalexin (KEFLEX) 250 mg capsule Take one capsule by mouth four times daily.    Cholecalciferol (Vitamin D3) 25 mcg (1,000 unit) cap Take three capsules by mouth daily.    d-mannose 500 mg cap Take 1 capsule by mouth twice daily.    folic acid/multivit-min/lutein (CENTRUM SILVER PO) Take  by mouth.    lactobacillus combination no.4 (SENIOR PROBIOTIC PO) Take  by mouth.    losartan (COZAAR) 50 mg tablet Take one tablet by mouth daily.    Lutein 20 mg cap Take one capsule by mouth.    magnesium oxide (MAGOX) 400 mg (241.3 mg magnesium) tablet Take 250 mg by mouth daily.    metoprolol (LOPRESSOR) 25 mg tablet Take one-half tablet by mouth daily.    NAPROXEN SODIUM (ALEVE PO) Take  by mouth as Needed.    polyethylene glycol 3350 (MIRALAX) 17 g packet Take one packet by mouth daily as needed.    tolterodine LA (DETROL LA) 4 mg capsule Take one capsule by mouth at bedtime daily.       There were no vitals filed for this visit.  There is no height or weight on file to calculate BMI.                  Pain Addressed:  N/A    Patient Evaluated for a Clinical Trial: No treatment clinical trial available for this patient.     [place] Cooperative Oncology Group performance status is 1, Restricted in physically strenuous activity but ambulatory and able to carry out work of a light or sedentary nature, e.g., light house work, office work.     Physical Exam  Constitutional:       Appearance: She is not ill-appearing.   HENT:      Head: Normocephalic and atraumatic.      Mouth/Throat:      Mouth: Mucous membranes are moist.      Pharynx: Oropharynx is clear.   Eyes:      Conjunctiva/sclera: Conjunctivae normal.   Cardiovascular:      Rate and Rhythm: Normal rate and regular rhythm.   Pulmonary:      Effort: Pulmonary effort is normal.      Breath sounds: Normal breath sounds.   Abdominal:      General: There is no distension.      Palpations: Abdomen is soft.      Tenderness: There is no guarding.   Musculoskeletal:      Cervical back: Neck supple.      Right lower  leg: No edema.      Left lower leg: No edema.   Lymphadenopathy:      Cervical: No cervical adenopathy.   Skin:     General: Skin is warm and dry.   Neurological:      General: No focal deficit present.      Mental Status: She is alert and oriented to person, place, and time.      Gait: Gait (ambulates w/ walker) normal.   Psychiatric:         Mood and Affect: Mood normal.         Behavior: Behavior normal.            Recent Results (from the past 672 hour(s))   COMPREHENSIVE METABOLIC PANEL    Collection Time: 03/30/23  1:21 PM   Result Value Ref Range    Sodium 141 137 - 147 MMOL/L    Potassium 4.7 3.5 - 5.1 MMOL/L    Chloride 102 98 - 110 MMOL/L    Glucose 73 70 - 100 MG/DL    Blood Urea Nitrogen 26 (H) 7 - 25 MG/DL    Creatinine 1.47 0.4 - 1.00 MG/DL    Calcium 9.9 8.5 - 82.9 MG/DL    Total Protein 7.5 6.0 - 8.0 G/DL    Total Bilirubin 0.5 0.2 - 1.3 MG/DL    Albumin 4.3 3.5 - 5.0 G/DL    Alk Phosphatase 92 25 - 110 U/L    AST (SGOT) 21 7 - 40 U/L    CO2 28 21 - 30 MMOL/L    ALT (SGPT) 13 7 - 56 U/L    Anion Gap 11 3 - 12    eGFR 57 (L) >60 mL/min   CBC AND DIFF    Collection Time: 03/30/23  1:21 PM   Result Value Ref Range    White Blood Cells 8.3 4.5 - 11.0 K/UL    RBC 4.41 4.0 - 5.0 M/UL    Hemoglobin 14.8 12.0 - 15.0 GM/DL    Hematocrit 56.2 36 - 45 %    MCV 100.2 (H) 80 - 100 FL    MCH 33.6 26 - 34 PG    MCHC 33.5 32.0 - 36.0 G/DL    RDW 13.0 11 - 15 %    Platelet Count 204 150 - 400 K/UL    MPV 9.1 7 - 11 FL    Neutrophils 62 41 - 77 %    Lymphocytes 29 24 - 44 %    Monocytes 7 4 - 12 %    Eosinophils 1 0 - 5 %    Basophils 1 0 - 2 %    Absolute Neutrophil Count 5.19 1.8 - 7.0 K/UL    Absolute Lymph Count 2.45 1.0 - 4.8 K/UL    Absolute Monocyte Count 0.56 0 - 0.80 K/UL    Absolute Eosinophil Count 0.10 0 - 0.45 K/UL    Absolute Basophil Count 0.05 0 - 0.20 K/UL             ASSESSMENT AND PLAN:    The patient is a 89 y.o. female with the following:    Right breast ILC (2015), 3.5 cm, G2, 0/4 lymph nodes (ER100%, PR0%, HER2 0, Ki-67 68%).  Pathologic: Stage IIA (T2, N0, cM0). Completed left breast radiation in [place], [place]. 5 years Arimidex.   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 ER 100, PR 87. Clinical stage: Stage 0 (cTis (DCIS)    We have discussed  the pathology results in detail with Stephanie Bentley and her family who accompanied her to the visit today, including the histologic subtype, tumor size, grade, and prognostic markers. We have outlined the general treatment principles related to early stage breast cancer including surgical resection, radiation therapy and hormonal therapy. We have discussed that the standard of care for DCIS includes lumpectomy followed by radiation therapy or mastectomy.  Following definitive local therapy the long term risk of systemic recurrence is less than 2%.      She is following with Dr Vear Clock for surgical planning. Scheduled for lumpectomy with radioactive seed placement on 04/18/23.     Discussed follow up with med onc following surgery to review surgical pathology.     The patient was encouraged to contact us should there be any questions after the visit.      Total Time Today was 50 minutes in the following activities: Preparing to see the patient, Obtaining and/or reviewing separately obtained history, Performing a medically appropriate examination and/or evaluation, Counseling and educating the patient/family/caregiver, Ordering medications, tests, or procedures, Documenting clinical information in the electronic or other health record and Independently interpreting results (not separately reported) and communicating results to the patient/family/caregiver

## 2023-04-14 ENCOUNTER — Encounter: Admit: 2023-04-14 | Discharge: 2023-04-14 | Payer: MEDICARE

## 2023-04-14 ENCOUNTER — Ambulatory Visit: Admit: 2023-04-14 | Discharge: 2023-04-14 | Payer: MEDICARE

## 2023-04-14 DIAGNOSIS — Z1379 Encounter for other screening for genetic and chromosomal anomalies: Secondary | ICD-10-CM

## 2023-04-14 DIAGNOSIS — N816 Rectocele: Secondary | ICD-10-CM

## 2023-04-14 DIAGNOSIS — S62102A Fracture of unspecified carpal bone, left wrist, initial encounter for closed fracture: Secondary | ICD-10-CM

## 2023-04-14 DIAGNOSIS — M1990 Unspecified osteoarthritis, unspecified site: Secondary | ICD-10-CM

## 2023-04-14 DIAGNOSIS — R32 Unspecified urinary incontinence: Secondary | ICD-10-CM

## 2023-04-14 DIAGNOSIS — D044 Carcinoma in situ of skin of scalp and neck: Secondary | ICD-10-CM

## 2023-04-14 DIAGNOSIS — C50911 Malignant neoplasm of unspecified site of right female breast: Secondary | ICD-10-CM

## 2023-04-14 DIAGNOSIS — C4441 Basal cell carcinoma of skin of scalp and neck: Secondary | ICD-10-CM

## 2023-04-14 DIAGNOSIS — D0439 Carcinoma in situ of skin of other parts of face: Secondary | ICD-10-CM

## 2023-04-14 DIAGNOSIS — I10 Essential (primary) hypertension: Secondary | ICD-10-CM

## 2023-04-14 DIAGNOSIS — I499 Cardiac arrhythmia, unspecified: Secondary | ICD-10-CM

## 2023-04-14 DIAGNOSIS — M549 Dorsalgia, unspecified: Secondary | ICD-10-CM

## 2023-04-14 DIAGNOSIS — C44319 Basal cell carcinoma of skin of other parts of face: Secondary | ICD-10-CM

## 2023-04-14 DIAGNOSIS — H9190 Unspecified hearing loss, unspecified ear: Secondary | ICD-10-CM

## 2023-04-14 DIAGNOSIS — D045 Carcinoma in situ of skin of trunk: Secondary | ICD-10-CM

## 2023-04-14 DIAGNOSIS — S7291XA Unspecified fracture of right femur, initial encounter for closed fracture: Secondary | ICD-10-CM

## 2023-04-14 DIAGNOSIS — D0512 Intraductal carcinoma in situ of left breast: Secondary | ICD-10-CM

## 2023-04-14 DIAGNOSIS — E7800 Pure hypercholesterolemia, unspecified: Secondary | ICD-10-CM

## 2023-04-14 DIAGNOSIS — Z92241 Personal history of systemic steroid therapy: Secondary | ICD-10-CM

## 2023-04-14 DIAGNOSIS — Z923 Personal history of irradiation: Secondary | ICD-10-CM

## 2023-04-14 MED ORDER — LIDOCAINE 1% (BUFFERED) SYRINGE (BREAST CENTER)
1 mL | Freq: Once | INTRAMUSCULAR | 0 refills | Status: CP
Start: 2023-04-14 — End: ?

## 2023-04-14 NOTE — Progress Notes
Stephanie Bentley is here today for an US guided Left Breast Radioactive Seed Placement. Education and risk of procedure discussed.  Consent signed with Dr. Jannette Spanner and breast marked per protocol.

## 2023-04-18 ENCOUNTER — Encounter: Admit: 2023-04-18 | Discharge: 2023-04-18 | Payer: MEDICARE

## 2023-04-18 ENCOUNTER — Ambulatory Visit: Admit: 2023-04-18 | Discharge: 2023-04-18 | Payer: MEDICARE

## 2023-04-18 DIAGNOSIS — D045 Carcinoma in situ of skin of trunk: Secondary | ICD-10-CM

## 2023-04-18 DIAGNOSIS — Z92241 Personal history of systemic steroid therapy: Secondary | ICD-10-CM

## 2023-04-18 DIAGNOSIS — N816 Rectocele: Secondary | ICD-10-CM

## 2023-04-18 DIAGNOSIS — C50911 Malignant neoplasm of unspecified site of right female breast: Secondary | ICD-10-CM

## 2023-04-18 DIAGNOSIS — E7800 Pure hypercholesterolemia, unspecified: Secondary | ICD-10-CM

## 2023-04-18 DIAGNOSIS — Z923 Personal history of irradiation: Secondary | ICD-10-CM

## 2023-04-18 DIAGNOSIS — C44319 Basal cell carcinoma of skin of other parts of face: Secondary | ICD-10-CM

## 2023-04-18 DIAGNOSIS — C4441 Basal cell carcinoma of skin of scalp and neck: Secondary | ICD-10-CM

## 2023-04-18 DIAGNOSIS — S62102A Fracture of unspecified carpal bone, left wrist, initial encounter for closed fracture: Secondary | ICD-10-CM

## 2023-04-18 DIAGNOSIS — I499 Cardiac arrhythmia, unspecified: Secondary | ICD-10-CM

## 2023-04-18 DIAGNOSIS — M1990 Unspecified osteoarthritis, unspecified site: Secondary | ICD-10-CM

## 2023-04-18 DIAGNOSIS — H9190 Unspecified hearing loss, unspecified ear: Secondary | ICD-10-CM

## 2023-04-18 DIAGNOSIS — R32 Unspecified urinary incontinence: Secondary | ICD-10-CM

## 2023-04-18 DIAGNOSIS — M549 Dorsalgia, unspecified: Secondary | ICD-10-CM

## 2023-04-18 DIAGNOSIS — Z1379 Encounter for other screening for genetic and chromosomal anomalies: Secondary | ICD-10-CM

## 2023-04-18 DIAGNOSIS — S7291XA Unspecified fracture of right femur, initial encounter for closed fracture: Secondary | ICD-10-CM

## 2023-04-18 DIAGNOSIS — D044 Carcinoma in situ of skin of scalp and neck: Secondary | ICD-10-CM

## 2023-04-18 DIAGNOSIS — D0439 Carcinoma in situ of skin of other parts of face: Secondary | ICD-10-CM

## 2023-04-18 DIAGNOSIS — I10 Essential (primary) hypertension: Secondary | ICD-10-CM

## 2023-04-18 MED ORDER — LIDOCAINE (PF) 200 MG/10 ML (2 %) IJ SYRG
INTRAVENOUS | 0 refills | Status: DC
Start: 2023-04-18 — End: 2023-04-18

## 2023-04-18 MED ORDER — PROPOFOL 10 MG/ML IV EMUL 50 ML (INFUSION)(AM)(OR)
INTRAVENOUS | 0 refills | Status: DC
Start: 2023-04-18 — End: 2023-04-18
  Administered 2023-04-18: 18:00:00 150 ug/kg/min via INTRAVENOUS

## 2023-04-18 MED ORDER — ARTIFICIAL TEARS (PF) SINGLE DOSE DROPS GROUP
OPHTHALMIC | 0 refills | Status: DC
Start: 2023-04-18 — End: 2023-04-18

## 2023-04-18 MED ORDER — PHENYLEPHRINE HCL IN 0.9% NACL 1 MG/10 ML (100 MCG/ML) IV SYRG
INTRAVENOUS | 0 refills | Status: DC
Start: 2023-04-18 — End: 2023-04-18

## 2023-04-18 MED ORDER — DEXAMETHASONE SODIUM PHOSPHATE 4 MG/ML IJ SOLN
INTRAVENOUS | 0 refills | Status: DC
Start: 2023-04-18 — End: 2023-04-18

## 2023-04-18 MED ORDER — FENTANYL CITRATE (PF) 50 MCG/ML IJ SOLN
INTRAVENOUS | 0 refills | Status: DC
Start: 2023-04-18 — End: 2023-04-18

## 2023-04-18 MED ORDER — ONDANSETRON HCL (PF) 4 MG/2 ML IJ SOLN
INTRAVENOUS | 0 refills | Status: DC
Start: 2023-04-18 — End: 2023-04-18

## 2023-04-18 MED ORDER — PROPOFOL INJ 10 MG/ML IV VIAL
INTRAVENOUS | 0 refills | Status: DC
Start: 2023-04-18 — End: 2023-04-18

## 2023-04-18 MED ADMIN — OXYCODONE 5 MG PO TAB [10814]: 5 mg | ORAL | @ 20:00:00 | Stop: 2023-04-18 | NDC 00904696661

## 2023-04-18 MED ADMIN — LACTATED RINGERS IV SOLP [4318]: 1000 mL | INTRAVENOUS | @ 17:00:00 | Stop: 2023-04-20 | NDC 00338011704

## 2023-04-18 MED ADMIN — FENTANYL CITRATE (PF) 50 MCG/ML IJ SOLN [3037]: 50 ug | INTRAVENOUS | @ 20:00:00 | Stop: 2023-04-18 | NDC 00641624701

## 2023-04-18 MED ADMIN — CEFAZOLIN 2 GRAM IV SOLR [462609]: 2 g | INTRAVENOUS | @ 18:00:00 | Stop: 2023-04-18 | NDC 00143913901

## 2023-04-18 MED ADMIN — FENTANYL CITRATE (PF) 50 MCG/ML IJ SOLN [3037]: 50 ug | INTRAVENOUS | @ 19:00:00 | Stop: 2023-04-18 | NDC 00641624701

## 2023-04-18 MED FILL — TRAMADOL 50 MG PO TAB: 50 mg | ORAL | 2 days supply | Qty: 10 | Fill #1 | Status: CP

## 2023-04-18 NOTE — Anesthesia Procedure Notes
Procedure: Airway Placement    AIRWAY INSERTION    Date/Time: 04/18/2023 1:07 PM    Patient location: OR  Urgency: elective  Difficult Airway: No            Airway Procedure  Indication(s) for airway management: surgery        no    Preoxygenated: yes  Patient position: sniffing  Neck stabilization: no in-line stabilization    Mask difficulty assessment: 0 - not attempted      Procedure Outcome  Final airway type: supraglottic airway (SGA)          Supraglottic airway: unique; SGA size: 4;                      Number of attempts at approach: 1            Complications  Cardiovascular:   Pulmonary:   Procedure: airway not difficult  Medication:         Performed by: Normajean Glasgow, MD  Authorized by: Normajean Glasgow, MD

## 2023-04-18 NOTE — Anesthesia Post-Procedure Evaluation
Post-Anesthesia Evaluation    Name: Stephanie Bentley      MRN: [id number]     DOB: 21-Oct-1931     Age: 89 y.o.     Sex: female   __________________________________________________________________________     Procedure Information       Anesthesia Start Date/Time: 04/18/23 1258    Procedures:       Left Radioactive Seed Localized Lumpectomy (Left: Breast) - Dr. Sharlene Dory case length:1.5hrs **      RADIOLOGICAL EXAM SURGICAL SPECIMEN (Left: Breast)      ADJACENT TRANSFER/ REARRANGEMENT 10.1 SQ CM TO 30.0 SQ CM - TORSO (Left: Breast)    Location: [facility] OR 1 / [facility] MAIN OR/PERIOP    Surgeons: Renford Dills, MD            Post-Anesthesia Vitals  BP: 113/73 (07/02 1827)  Temp: 36.7 ?C (98.1 ?F) (07/02 1715)  Pulse: 78 (07/02 1827)  Respirations: 18 PER MINUTE (07/02 1827)  SpO2: 94 % (07/02 1827)  O2 Device: Nasal cannula (07/02 1827)   Vitals Value Taken Time   BP 113/73 04/18/23 1827   Temp 36.7 ?C (98.1 ?F) 04/18/23 1715   Pulse 78 04/18/23 1827   Respirations 18 PER MINUTE 04/18/23 1827   SpO2 94 % 04/18/23 1827   O2 Device Nasal cannula 04/18/23 1827   ABP     ART BP           Post Anesthesia Evaluation Note    Evaluation location: pre/post  Patient participation: recovered; patient participated in evaluation  Level of consciousness: alert    Pain score: 0  Pain management: adequate    Hydration: normovolemia  Temperature: 36.0?C - 38.4?C  Airway patency: adequate    Perioperative Events       Post-op nausea and vomiting: no PONV    Postoperative Status  Cardiovascular status: hemodynamically stable  Respiratory status: spontaneous ventilation  Additional comments: Pt awake and alert, VSS.  Denies complaints.  Ready for discharge at 1827        Perioperative Events  There were no known complications for this encounter.

## 2023-04-18 NOTE — Anesthesia Pre-Procedure Evaluation
[facility] Anesthesia Pre-Procedure Evaluation    Name: Stephanie Bentley      MRN: [id number]     DOB: 24-Jul-1932     Age: 89 y.o.     Sex: female   _________________________________________________________________________     Procedure Info:   Procedure Information       Date/Time: 04/18/23 1355    Procedures:       Left Radioactive Seed Localized Lumpectomy (Left: Breast) - Dr. Sharlene Dory case length:1.5hrs **      RADIOLOGICAL EXAM SURGICAL SPECIMEN (Left)    Location: [facility] OR 1 / [facility] OR/PER[facility]    Surgeons: Renford Dills, MD            Physical Assessment  Vital Signs (last filed in past 24 hours):         Patient History   Allergies   Allergen Reactions    Sulfa (Sulfonamide Antibiotics) NAUSEA ONLY        Current Medications    Medication Directions   acetaminophen (TYLENOL) 500 mg tablet Take two tablets by mouth as Needed for Pain.   ascorbic acid (VITAMIN C PO) Take  by mouth twice daily.   baclofen 5 mg tablet Take one tablet by mouth three times daily as needed.   CALCIUM PO Take 1,200 mg by mouth.   cephalexin (KEFLEX) 250 mg capsule Take one capsule by mouth four times daily.   Cholecalciferol (Vitamin D3) 25 mcg (1,000 unit) cap Take three capsules by mouth daily.   d-mannose 500 mg cap Take 1 capsule by mouth twice daily.   folic acid/multivit-min/lutein (CENTRUM SILVER PO) Take  by mouth.   lactobacillus combination no.4 (SENIOR PROBIOTIC PO) Take  by mouth.   losartan (COZAAR) 50 mg tablet Take one tablet by mouth daily.   Lutein 20 mg cap Take one capsule by mouth.   magnesium oxide (MAGOX) 400 mg (241.3 mg magnesium) tablet Take 250 mg by mouth daily.   metoprolol (LOPRESSOR) 25 mg tablet Take one-half tablet by mouth daily.   NAPROXEN SODIUM (ALEVE PO) Take  by mouth as Needed.   polyethylene glycol 3350 (MIRALAX) 17 g packet Take one packet by mouth daily as needed.   tolterodine LA (DETROL LA) 4 mg capsule Take one capsule by mouth at bedtime daily.       Review of Systems/Medical History      Patient summary reviewed  Nursing notes reviewed  Pertinent labs reviewed    PONV Screening: Non-smoker and Female sex        Pulmonary           Cardiovascular       Recent diagnostic studies:          ECG          05-21-21 in [facility]      Exercise tolerance: <4 METS (uses walker for ambulation, but does most of own housework, admits to shortness of air with heavy exertion; lives alone)       Beta Blocker therapy: Yes      Hypertension                  Dysrhythmias (I've had an irregular heartbeat most of my life, and been on Metoprolol forever; pt states it is NOT Afib or SVT, and hasn't needed to see cards in years)            Musculoskeletal         Back pain      Arthritis:  osteo      Fractures (right femur fx - 2022 - uses walker)        Endocrine/Other             Malignancy (multiple BCC and SCC; right breast ca - 2015, left breast DCIS):    current         Physical Exam    Airway Findings      Mallampati: II      TM distance: >3 FB      Neck ROM: limited    Dental Findings: Negative      Cardiovascular Findings:       Rhythm: regular         Diagnostic Tests  Hematology:   Lab Results   Component Value Date    HGB 14.8 03/30/2023    HCT 44.2 03/30/2023    PLTCT 204 03/30/2023    WBC 8.3 03/30/2023    NEUT 62 03/30/2023    ANC 5.19 03/30/2023    ALC 2.45 03/30/2023    MONA 7 03/30/2023    AMC 0.56 03/30/2023    EOSA 1 03/30/2023    ABC 0.05 03/30/2023    MCV 100.2 03/30/2023    MCH 33.6 03/30/2023    MCHC 33.5 03/30/2023    MPV 9.1 03/30/2023    RDW 12.9 03/30/2023         General Chemistry:   Lab Results   Component Value Date    NA 141 03/30/2023    K 4.7 03/30/2023    CL 102 03/30/2023    CO2 28 03/30/2023    GAP 11 03/30/2023    BUN 26 03/30/2023    CR 0.95 03/30/2023    GLU 73 03/30/2023    CA 9.9 03/30/2023    ALBUMIN 4.3 03/30/2023    TOTBILI 0.5 03/30/2023      Coagulation: No results found for: PT, PTT, INR    PAC Plan    Interview: Phone Screen Interview Alerts    Anesthesia Plan    ASA score: 2   Plan: general      Informed Consent  Anesthetic plan and risks discussed with patient.        Plan discussed with: CRNA.

## 2023-04-19 MED ADMIN — SENNOSIDES-DOCUSATE SODIUM 8.6-50 MG PO TAB [40926]: 1 | ORAL | @ 02:00:00 | NDC 00536124801

## 2023-04-19 MED ADMIN — METOPROLOL TARTRATE 25 MG PO TAB [37637]: 12.5 mg | ORAL | @ 14:00:00 | Stop: 2023-04-19 | NDC 51079025501

## 2023-04-19 MED ADMIN — ACETAMINOPHEN 500 MG PO TAB [102]: 1000 mg | ORAL | @ 12:00:00 | Stop: 2023-04-19 | NDC 00904673061

## 2023-04-19 MED ADMIN — LOSARTAN 50 MG PO TAB [76938]: 50 mg | ORAL | @ 14:00:00 | Stop: 2023-04-19 | NDC 68084034711

## 2023-04-19 MED ADMIN — SENNOSIDES-DOCUSATE SODIUM 8.6-50 MG PO TAB [40926]: 1 | ORAL | @ 14:00:00 | Stop: 2023-04-19 | NDC 00536124801

## 2023-04-19 MED ADMIN — SODIUM CHLORIDE 0.9 % FLUSH [210767]: 10 mL | INTRAVENOUS | @ 14:00:00 | Stop: 2023-04-19 | NDC 08290306546

## 2023-04-19 MED ADMIN — ACETAMINOPHEN 500 MG PO TAB [102]: 1000 mg | ORAL | NDC 00904673061

## 2023-04-19 MED ADMIN — ACETAMINOPHEN 500 MG PO TAB [102]: 1000 mg | ORAL | @ 17:00:00 | Stop: 2023-04-19 | NDC 00904673061

## 2023-04-19 MED ADMIN — ACETAMINOPHEN 500 MG PO TAB [102]: 1000 mg | ORAL | @ 06:00:00 | Stop: 2023-04-19 | NDC 00904673061

## 2023-04-20 ENCOUNTER — Encounter: Admit: 2023-04-20 | Discharge: 2023-04-20 | Payer: MEDICARE

## 2023-04-20 DIAGNOSIS — Z1379 Encounter for other screening for genetic and chromosomal anomalies: Secondary | ICD-10-CM

## 2023-04-20 DIAGNOSIS — H9190 Unspecified hearing loss, unspecified ear: Secondary | ICD-10-CM

## 2023-04-20 DIAGNOSIS — Z923 Personal history of irradiation: Secondary | ICD-10-CM

## 2023-04-20 DIAGNOSIS — C4441 Basal cell carcinoma of skin of scalp and neck: Secondary | ICD-10-CM

## 2023-04-20 DIAGNOSIS — M1990 Unspecified osteoarthritis, unspecified site: Secondary | ICD-10-CM

## 2023-04-20 DIAGNOSIS — I499 Cardiac arrhythmia, unspecified: Secondary | ICD-10-CM

## 2023-04-20 DIAGNOSIS — N816 Rectocele: Secondary | ICD-10-CM

## 2023-04-20 DIAGNOSIS — D0439 Carcinoma in situ of skin of other parts of face: Secondary | ICD-10-CM

## 2023-04-20 DIAGNOSIS — E7800 Pure hypercholesterolemia, unspecified: Secondary | ICD-10-CM

## 2023-04-20 DIAGNOSIS — C44319 Basal cell carcinoma of skin of other parts of face: Secondary | ICD-10-CM

## 2023-04-20 DIAGNOSIS — M549 Dorsalgia, unspecified: Secondary | ICD-10-CM

## 2023-04-20 DIAGNOSIS — C50911 Malignant neoplasm of unspecified site of right female breast: Secondary | ICD-10-CM

## 2023-04-20 DIAGNOSIS — S62102A Fracture of unspecified carpal bone, left wrist, initial encounter for closed fracture: Secondary | ICD-10-CM

## 2023-04-20 DIAGNOSIS — S7291XA Unspecified fracture of right femur, initial encounter for closed fracture: Secondary | ICD-10-CM

## 2023-04-20 DIAGNOSIS — D044 Carcinoma in situ of skin of scalp and neck: Secondary | ICD-10-CM

## 2023-04-20 DIAGNOSIS — I10 Essential (primary) hypertension: Secondary | ICD-10-CM

## 2023-04-20 DIAGNOSIS — R32 Unspecified urinary incontinence: Secondary | ICD-10-CM

## 2023-04-20 DIAGNOSIS — Z92241 Personal history of systemic steroid therapy: Secondary | ICD-10-CM

## 2023-04-20 DIAGNOSIS — D045 Carcinoma in situ of skin of trunk: Secondary | ICD-10-CM

## 2023-05-01 ENCOUNTER — Encounter: Admit: 2023-05-01 | Discharge: 2023-05-01 | Payer: MEDICARE

## 2023-05-01 NOTE — Progress Notes
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 She is doing well post-operatively. Her pain is well-controlled with Tylenol. She denies any issues at her incision site.    PHYSICAL EXAM:    Breast:  right    Incision:  well-healing, no signs of infection    Erythema:  No    Ecchymosis:  No    Seroma:  No    Upper Extremity:    ROM:  full    Winged scapula: No    Lymphedema:  No    PATHOLOGY:  Final Diagnosis:     A.  Breast, left radioactive seed lumpectomy, short stitch superior, long   stitch lateral, 1 seed, 1 clip, lumpectomy:    Ductal carcinoma in situ, low-grade, cribriform type.  See checklist   Prior biopsy site.     B.  Breast, left superior margin, ink marks true margin, excision:    Negative for malignancy.         C.  Breast, left lateral margin, ink marks true margin, excision:    Negative for malignancy.     D.  Breast, left anterior margin, ink marks true margin, excision:    Negative for malignancy.     E.  Breast, left posterior margin, ink marks true margin, excision:    Negative for malignancy.     F.  Breast, left medial margin, ink marks true margin, excision:    Negative for malignancy.     G.  Breast, left inferior margin, ink marks true margin, excision:    Negative for malignancy.     Comment:   DCIS OF THE BREAST: RESECTION   CAP Version: Breast DCIS 4.4.0.0     Procedure: Lumpectomy   Specimen Laterality: Left   Tumor Site: 1:00, per clinical note   Histologic Type: Ductal carcinoma in situ   Size (Extent) of DCIS: 1.2 x 0.8 x 0.4 cm   Nuclear Grade: Grade I (low)   Necrosis: Not identified   Microcalcifications: Not identified in DCIS.  Arterial calcinosis is   noted.   Margin Status:   Lumpectomy   Main lumpectomy specimen:   The closest margin is 0.3 mm from the medial margin. All remaining   margins are greater than 2 mm.   Final shave margins:   All margins are negative for in-situ carcinoma.   Regional Lymph Node Status:   Not applicable (no lymph nodes submitted or found)   Distant Site(s) Involved, if applicable: Not applicable     Biomarker Testing: Performed on prior biopsy [id number]   Time between tumor removal and placement into formalin < 1 hour: Yes   Fixation time between 6-72 hours: Yes     Pathologic TNM (pTNM, AJCC 8th Edition): pTis(DCIS) N-not assigned     TNM Descriptors   Not applicable   Primary Tumor (pT)   pTis (DCIS):     Ductal carcinoma in situ     Regional Lymph Nodes (pN)   pN  not assigned (no nodes submitted or found)     The pathologic stage assigned here should be regarded as provisional, as   it reflects only current pathologic data and does not incorporate full   knowledge of the patient's clinical status and/or prior pathology.     Block for Biomarker Testing: A5      Attestation:   By this signature, I attest that I have personally formulated the final   interpretation  expressed in this report and that the above diagnosis is   based upon my examination of the slides and/or other material indicated in   this report.     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     The pathology was previously reviewed with Ms. Mcbrayer and was again reviewed today with her and her daughter. She was also given a copy of the results today. She is doing well. She may gradually return to regular activity as tolerated with full, unrestricted activity at 4 weeks post-op. She will continue to follow with Dr. Biagio Borg for medical oncology. She will be referred to radiation oncology. She will follow-up in clinic in 9 months with bilateral mammogram at that time. She was given ample time to ask questions all of which were answered to her satisfaction. She was encouraged to call with any interval questions or concerns.     Continue follow up with Dr. Biagio Borg  Radiation oncology referral  RTC in 9 months with bilateral diagnostic mammogram at that time       Reola Mosher. Manson Passey, DO   Fellow - Breast Surgical Oncology    ATTESTATION    I personally performed the key portions of the E/M visit, discussed case with fellow and concur with fellow documentation of history, physical exam, assessment, and treatment plan unless otherwise noted.    Staff name:  Renford Dills, MD Date:  05/03/2023

## 2023-05-03 ENCOUNTER — Encounter: Admit: 2023-05-03 | Discharge: 2023-05-03 | Payer: MEDICARE

## 2023-05-03 DIAGNOSIS — Z1379 Encounter for other screening for genetic and chromosomal anomalies: Secondary | ICD-10-CM

## 2023-05-03 DIAGNOSIS — D045 Carcinoma in situ of skin of trunk: Secondary | ICD-10-CM

## 2023-05-03 DIAGNOSIS — Z9889 Other specified postprocedural states: Secondary | ICD-10-CM

## 2023-05-03 DIAGNOSIS — D044 Carcinoma in situ of skin of scalp and neck: Secondary | ICD-10-CM

## 2023-05-03 DIAGNOSIS — H9190 Unspecified hearing loss, unspecified ear: Secondary | ICD-10-CM

## 2023-05-03 DIAGNOSIS — D0439 Carcinoma in situ of skin of other parts of face: Secondary | ICD-10-CM

## 2023-05-03 DIAGNOSIS — D0512 Intraductal carcinoma in situ of left breast: Secondary | ICD-10-CM

## 2023-05-03 DIAGNOSIS — E7800 Pure hypercholesterolemia, unspecified: Secondary | ICD-10-CM

## 2023-05-03 DIAGNOSIS — C4441 Basal cell carcinoma of skin of scalp and neck: Secondary | ICD-10-CM

## 2023-05-03 DIAGNOSIS — S7291XA Unspecified fracture of right femur, initial encounter for closed fracture: Secondary | ICD-10-CM

## 2023-05-03 DIAGNOSIS — M1990 Unspecified osteoarthritis, unspecified site: Secondary | ICD-10-CM

## 2023-05-03 DIAGNOSIS — Z92241 Personal history of systemic steroid therapy: Secondary | ICD-10-CM

## 2023-05-03 DIAGNOSIS — Z923 Personal history of irradiation: Secondary | ICD-10-CM

## 2023-05-03 DIAGNOSIS — N816 Rectocele: Secondary | ICD-10-CM

## 2023-05-03 DIAGNOSIS — M549 Dorsalgia, unspecified: Secondary | ICD-10-CM

## 2023-05-03 DIAGNOSIS — S62102A Fracture of unspecified carpal bone, left wrist, initial encounter for closed fracture: Secondary | ICD-10-CM

## 2023-05-03 DIAGNOSIS — C50911 Malignant neoplasm of unspecified site of right female breast: Secondary | ICD-10-CM

## 2023-05-03 DIAGNOSIS — R32 Unspecified urinary incontinence: Secondary | ICD-10-CM

## 2023-05-03 DIAGNOSIS — I10 Essential (primary) hypertension: Secondary | ICD-10-CM

## 2023-05-03 DIAGNOSIS — C44319 Basal cell carcinoma of skin of other parts of face: Secondary | ICD-10-CM

## 2023-05-03 DIAGNOSIS — I499 Cardiac arrhythmia, unspecified: Secondary | ICD-10-CM

## 2023-05-10 NOTE — Patient Instructions
Thank you for coming to see us today.   Please call our office or send a message through MyChart if you have any questions or concerns.          Dr. Anne O'Dea and Jenn Carter, APRN  Danielle Randolph, APRN  Nurses: Michele Kuester, Damein Gaunce and Gina Crimi  Phone: [phone]   Fax: [phone]    [facility] [place] [facility]  [address]  [place], [place] [address]    [facility]  [address]  [place], [place] [address]

## 2023-05-16 ENCOUNTER — Encounter: Admit: 2023-05-16 | Discharge: 2023-05-16 | Payer: MEDICARE

## 2023-05-16 DIAGNOSIS — C50911 Malignant neoplasm of unspecified site of right female breast: Secondary | ICD-10-CM

## 2023-05-16 DIAGNOSIS — R32 Unspecified urinary incontinence: Secondary | ICD-10-CM

## 2023-05-16 DIAGNOSIS — D044 Carcinoma in situ of skin of scalp and neck: Secondary | ICD-10-CM

## 2023-05-16 DIAGNOSIS — Z923 Personal history of irradiation: Secondary | ICD-10-CM

## 2023-05-16 DIAGNOSIS — D0512 Intraductal carcinoma in situ of left breast: Secondary | ICD-10-CM

## 2023-05-16 DIAGNOSIS — Z92241 Personal history of systemic steroid therapy: Secondary | ICD-10-CM

## 2023-05-16 DIAGNOSIS — S62102A Fracture of unspecified carpal bone, left wrist, initial encounter for closed fracture: Secondary | ICD-10-CM

## 2023-05-16 DIAGNOSIS — H9190 Unspecified hearing loss, unspecified ear: Secondary | ICD-10-CM

## 2023-05-16 DIAGNOSIS — Z1379 Encounter for other screening for genetic and chromosomal anomalies: Secondary | ICD-10-CM

## 2023-05-16 DIAGNOSIS — S7291XA Unspecified fracture of right femur, initial encounter for closed fracture: Secondary | ICD-10-CM

## 2023-05-16 DIAGNOSIS — C4441 Basal cell carcinoma of skin of scalp and neck: Secondary | ICD-10-CM

## 2023-05-16 DIAGNOSIS — E7800 Pure hypercholesterolemia, unspecified: Secondary | ICD-10-CM

## 2023-05-16 DIAGNOSIS — I10 Essential (primary) hypertension: Secondary | ICD-10-CM

## 2023-05-16 DIAGNOSIS — N816 Rectocele: Secondary | ICD-10-CM

## 2023-05-16 DIAGNOSIS — M549 Dorsalgia, unspecified: Secondary | ICD-10-CM

## 2023-05-16 DIAGNOSIS — C44319 Basal cell carcinoma of skin of other parts of face: Secondary | ICD-10-CM

## 2023-05-16 DIAGNOSIS — M1990 Unspecified osteoarthritis, unspecified site: Secondary | ICD-10-CM

## 2023-05-16 DIAGNOSIS — D0439 Carcinoma in situ of skin of other parts of face: Secondary | ICD-10-CM

## 2023-05-16 DIAGNOSIS — I499 Cardiac arrhythmia, unspecified: Secondary | ICD-10-CM

## 2023-05-16 DIAGNOSIS — D045 Carcinoma in situ of skin of trunk: Secondary | ICD-10-CM

## 2023-11-03 ENCOUNTER — Encounter: Admit: 2023-11-03 | Discharge: 2023-11-03 | Payer: MEDICARE

## 2023-11-10 ENCOUNTER — Encounter: Admit: 2023-11-10 | Discharge: 2023-11-10 | Payer: MEDICARE

## 2023-11-13 ENCOUNTER — Encounter: Admit: 2023-11-13 | Discharge: 2023-11-13 | Payer: MEDICARE

## 2023-11-20 ENCOUNTER — Encounter: Admit: 2023-11-20 | Discharge: 2023-11-20 | Payer: MEDICARE

## 2023-11-23 ENCOUNTER — Encounter: Admit: 2023-11-23 | Discharge: 2023-11-23 | Payer: MEDICARE

## 2024-01-19 ENCOUNTER — Encounter: Admit: 2024-01-19 | Discharge: 2024-01-19

## 2024-02-19 ENCOUNTER — Encounter: Admit: 2024-02-19 | Discharge: 2024-02-19 | Payer: MEDICARE

## 2024-02-23 ENCOUNTER — Encounter: Admit: 2024-02-23 | Discharge: 2024-02-23 | Payer: MEDICARE

## 2024-02-26 ENCOUNTER — Encounter: Admit: 2024-02-26 | Discharge: 2024-02-26 | Payer: MEDICARE

## 2024-02-28 ENCOUNTER — Ambulatory Visit: Admit: 2024-02-28 | Discharge: 2024-02-28 | Payer: MEDICARE

## 2024-02-28 ENCOUNTER — Encounter: Admit: 2024-02-28 | Discharge: 2024-02-28 | Payer: MEDICARE

## 2024-02-28 DIAGNOSIS — Z9889 Other specified postprocedural states: Secondary | ICD-10-CM

## 2024-02-28 DIAGNOSIS — D0512 Intraductal carcinoma in situ of left breast: Secondary | ICD-10-CM

## 2024-02-28 DIAGNOSIS — Z1231 Encounter for screening mammogram for malignant neoplasm of breast: Secondary | ICD-10-CM

## 2024-02-28 NOTE — Progress Notes
 Name: Stephanie Bentley          MRN: [id number]      DOB: [birth year]      AGE: 89 y.o.   DATE OF SERVICE: 02/28/2024    Subjective:             Reason for Visit:  Heme/Onc Care      Stephanie Bentley is a 89 y.o. female.      Cancer Staging   Ductal carcinoma in situ (DCIS) of left breast  Staging form: Breast, AJCC 8th Edition  - Clinical stage from 03/10/2023: Stage 0 (cTis (DCIS), cN0, cM0, ER+, PR+, HER2: Not Assessed) - Signed by Stephanie Francois, PA-C on 03/27/2023  - Pathologic stage from 04/18/2023: Stage 0 (pTis (DCIS), pN0, cM0, ER+, PR+, HER2: Not Assessed) - Signed by Stephanie Francois, PA-C on 05/01/2023    Personal history of breast cancer  Staging form: Breast, AJCC 7th Edition  - Clinical: Stage IIA (T2, N0, cM0) - Signed by Stephanie Oppenheim, PA-C on 08/19/2014  - Pathologic: Stage IIA (T2, N0, cM0) - Signed by Stephanie Coon, APRN-NP on 12/16/2014      History of Present Illness  Stephanie Bentley presents today with her daughter for follow-up of her bilateral breast cancers.  She has no new breast concerns or complaints.  She previously met with Stephanie Bentley and is on active surveillance not taking medication. She denies changes in her medical, surgical, social, family histories.    DIAGNOSIS:    1. 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 Myriad myRisk negative, dx 09/2021     HISTORY:  Stephanie Bentley is a female who presented to [facility] on 03/30/2023 at age 63 for left breast cancer. Stephanie Bentley has a history of right breast cancer in 2015. Initial biopsy 07/25/2014 revealed grade 2 IDC (ER positive).  She underwent a right breast sono-guided lumpectomy/right axillary sentinel lymph node biopsy on 08/29/14 with Dr. Lewis Red. Pathology demonstrated a 3.5 cm grade 2 ILC (ER100%, PR0%, HER2 0, Ki-67 68%), margins negative, no LVI, 0/4 lymph nodes. She completed Radiation Therapy with Dr. Georgeanna Kinds in [place] at [facility]. [name] in January 2016. She took Arimidex per Stephanie Bentley 10/2014 - 09/2020. She has been following with Stephanie Squibb, APRN-NP in [facility]. Bilateral screening mammogram 02/23/2023 (Stephanie Bentley) revealed a left breast asymmetry. Same-day left diagnostic mammogram and ultrasound revealed a 0.9 cm left breast mass at 1:00, 6 cm FTN with normal-appearing axillary lymph nodes. Ultrasound-guided left breast biopsy on 03/10/2023 revealed hormone-positive grade 2 DCIS. She denies any palpable breast masses, skin changes, nipple changes, or nipple discharge. She reports a history of an irregular heart beat, but denies history of atrial fibrillation, SVT, or other cardiac issues. She takes metoprolol for it and for her blood pressure. She denies any history of MI, DVT/PE, stroke. She is a former smoker, quit in 1993. She admits to some mild shortness of breath with exertion, but states it seems to be an expected amount at her age. She denies worsening shortness of breath or limitations in her activity. She uses a wheeled walker most of the time to ambulate, as she broke her femur in August 2022. She underwent left RSL lumpectomy on 04/18/2023. Pathology revealed 1.2 cm grade 1 DCIS with negative margins.     BREAST IMAGING:  Mammogram:    --Bilateral  screening mammogram 02/23/2023 ([facility]) revealed The breasts have scattered areas of fibroglandular density. Posttreatment findings within the right breast.  No suspicious right breast abnormality. There is a focal asymmetry within the upper outer left breast at middle depth on MLO image 20, CC image 18 and lateral image 27.    --Left diagnostic mammogram 02/23/2023 ([facility]) revealed Spot compression views of the left breast demonstrate partial effacement of the focal asymmetry in the upper outer quadrant 1:00, middle depth.  Further evaluation with diagnostic ultrasound will be performed.     Ultrasound:    --Left targeted ultrasound 02/23/2023 () revealed Within the left breast at the 1 o'clock position 6 cm from the nipple, there is a irregular hypoechoic mass with microlobulated margins.  This mass measures 0.9 x 0.6 x 0.8 cm.  This corresponds to the mass on mammogram 5 morphologically normal left axillary lymph nodes were identified. IMPRESSION: 1. Screening detected 0.9 cm left breast mass at 1:00, 6 cm from the nipple. Recommend ultrasound-guided biopsy for tissue diagnosis. 2.  No abnormal left axillary lymph nodes identified.       REPRODUCTIVE HEALTH:  Age at first Menarche: 87   Age at First Live Birth: 14   Age at Menopause:  64, no HRT   Gravida:  5  Para: 5    PROCEDURE: 1.  right breast sono-guided lumpectomy/right axillary sentinel lymph node biopsy on 08/29/14 (Dr. [place] Gilmore McGinness)  2. left RSL lumpectomy on 04/18/2023 Alida Ion)  PERTINENT PMH:  history of right breast cancer in 2015, BCC,  SCC, HTN, HLD,   FAMILY HISTORY:  No family history of breast, ovarian, prostate or pancreatic cancer  MEDICAL ONCOLOGY:    Stephanie Bentley PRESENT THERAPY: Observation  REFERRED BY: Stephanie Squibb, APRN-NP    BILATERAL DIAGNOSTIC MAMMOGRAM 02/28/2024  FINDINGS:     There are benign post-therapeutic findings. No suspicious abnormality is   seen.     IMPRESSION:     Benign post-therapeutic findings.     ASSESSMENT:   Left: 2 - Benign   Right: 2 - Benign   Overall: 2 - Benign      RECOMMENDATION AND DUE DATE:   Routine Screening Mammogram in 1 Year - Bilateral  03/01/2025              Objective:          acetaminophen (TYLENOL) 325 mg tablet Take two tablets by mouth every 6 hours. Take scheduled for 3 days after surgery, then as needed. Do not exceed 4,000mg  in a 24 hour period.    ascorbic acid (VITAMIN C PO) Take  by mouth twice daily.    baclofen 5 mg tablet Take one tablet by mouth three times daily as needed.    CALCIUM PO Take 1,200 mg by mouth.    cephalexin (KEFLEX) 250 mg capsule Take one capsule by mouth four times daily.    Cholecalciferol (Vitamin D3) 25 mcg (1,000 unit) cap Take three capsules by mouth daily.    d-mannose 500 mg cap Take one capsule by mouth twice daily.    folic acid/multivit-min/lutein (CENTRUM SILVER PO) Take  by mouth.    lactobacillus combination no.4 (SENIOR PROBIOTIC PO) Take  by mouth.    losartan (COZAAR) 50 mg tablet Take one tablet by mouth daily.    Lutein 20 mg cap Take one capsule by mouth.    magnesium oxide (MAGOX) 400 mg (241.3 mg magnesium) tablet Take 250 mg by mouth daily.    metoprolol (LOPRESSOR) 25 mg  tablet Take one-half tablet by mouth daily.    metoprolol succinate XL (TOPROL XL) 25 mg extended release tablet Take one-half tablet by mouth daily.    NAPROXEN SODIUM (ALEVE PO) Take  by mouth as Needed.    polyethylene glycol 3350 (MIRALAX) 17 g packet Take one packet by mouth daily as needed.    sennosides-docusate sodium (SENOKOT-S) 8.6/50 mg tablet Take one tablet by mouth twice daily. To prevent constipation while taking pain medication.    tolterodine LA (DETROL LA) 4 mg capsule Take one capsule by mouth at bedtime daily.    traMADoL (ULTRAM) 50 mg tablet Take one tablet to two tablets by mouth every 6 hours as needed for Pain (rated 5/10 or greater. Do not exceed 5 tabs in a 24 hour period.).     Vitals:    02/28/24 1219   BP: 116/59   BP Source: Arm, Left Upper   Pulse: 84   Temp: 36.6 ?C (97.9 ?F)   Resp: 16   SpO2: (!) 91%   TempSrc: Temporal   PainSc: Zero   Weight: 75.3 kg (166 lb)     Body mass index is 24.51 kg/m?Aaron Aas     Pain Score: Zero            Pain Addressed:  N/A    Patient Evaluated for a Clinical Trial: No treatment clinical trial available for this patient.     [place] Cooperative Oncology Group performance status is 0, Fully active, able to carry on all pre-disease performance without restriction.Aaron Aas     Physical Exam     RIGHT BREAST EXAM:  Breast:  No palpable masses, well healed incision, palpable scar tissue at site of previous lumpectomy  Skin Erythema:  No  Attachment of Overlying Skin:  No  Peau d' orange:  No  Chest Wall Attachment: No  Nipple Inversion:  No  Nipple Discharge: No    LEFT BREAST EXAM:  Breast: No palpable masses, well healed incision  Skin Erythema:  No  Attachment of Overlying Skin:  No  Peau d' orange:  No  Chest Wall Attachment: No  Nipple Inversion:  No  Nipple Discharge:  No    RIGHT NODAL BASIN EXAM:  Axillary:  negative  Infraclavicular:  negative  Supraclavicular:  negative    LEFT NODAL BASIN EXAM:  Axillary:  negative  Infraclavicular: negative  Supraclavicular:  negative    Constitutional: Alert, cooperative, NAD  Eyes: non-icteric, no discharge, pupils are equal, round  ENT: NCAT, moist mucous membranes  Cardiovascular: normal rate  Respiratory: non-labored respirations on RA, normal effort, no respiratory distress  Skin: warm, dry, no rashes or erythema, no cyanosis  Musculoskeletal: moves all extremities, uses walker for ambulation  Neuro: alert and orientated, no cranial nerve deficit  Psych: not anxious, behavior is normal, judgment and thought content normal  Lymphatic: no evidence of upper extremity lymphedema         Assessment and Plan:  DIAGNOSIS:    1. 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 Myriad myRisk negative, dx 09/2021    Ms. Zahner presents today with her daughter for routine follow-up of her bilateral breast cancers.  She has no new breast concerns or complaints today.  She continues to follow with Stephanie Bentley for Medical Oncology. She is not on medications at this time.  She had bilateral diagnostic mammogram performed today which  was BI-RADS 2 with recommendation to return to routine screening mammograms. There is no clinical evidence of disease recurrence on clinical examination today. She has no evidence of UE lymphedema on examination today. She will continue to follow with a [facility] and get BIS measurements as directed by that team. She is to return to the clinic in 1 year for clinical examination and breast imaging. She was given ample time to ask questions all of which were answered to her satisfaction. She was encouraged to call with any interval questions or concerns.     1. Continue follow up with Stephanie Bentley  2. [facility] appointments  3. RTC in 1 year for clinical examination and screening breast imaging        Wing Hauser, MD

## 2024-05-01 ENCOUNTER — Encounter: Admit: 2024-05-01 | Discharge: 2024-05-01 | Payer: MEDICARE

## 2024-11-19 ENCOUNTER — Encounter: Admit: 2024-11-19 | Discharge: 2024-11-19 | Payer: MEDICARE
